# Patient Record
Sex: Male | Born: 1950 | Race: White | Hispanic: No | Marital: Married | Smoking: Former smoker
Health system: Southern US, Community
[De-identification: ages and names within clinical notes are randomized; demographics above are authoritative.]

## PROBLEM LIST (undated history)

## (undated) DIAGNOSIS — G250 Essential tremor: Secondary | ICD-10-CM

## (undated) DIAGNOSIS — N401 Enlarged prostate with lower urinary tract symptoms: Secondary | ICD-10-CM

## (undated) DIAGNOSIS — G629 Polyneuropathy, unspecified: Secondary | ICD-10-CM

## (undated) DIAGNOSIS — Z862 Personal history of diseases of the blood and blood-forming organs and certain disorders involving the immune mechanism: Secondary | ICD-10-CM

## (undated) DIAGNOSIS — G20 Parkinson's disease: Secondary | ICD-10-CM

## (undated) DIAGNOSIS — I10 Essential (primary) hypertension: Secondary | ICD-10-CM

## (undated) DIAGNOSIS — Z978 Presence of other specified devices: Secondary | ICD-10-CM

## (undated) DIAGNOSIS — E785 Hyperlipidemia, unspecified: Secondary | ICD-10-CM

## (undated) DIAGNOSIS — F329 Major depressive disorder, single episode, unspecified: Secondary | ICD-10-CM

## (undated) DIAGNOSIS — D696 Thrombocytopenia, unspecified: Secondary | ICD-10-CM

## (undated) DIAGNOSIS — G894 Chronic pain syndrome: Secondary | ICD-10-CM

## (undated) DIAGNOSIS — Z87898 Personal history of other specified conditions: Secondary | ICD-10-CM

## (undated) DIAGNOSIS — M542 Cervicalgia: Secondary | ICD-10-CM

## (undated) DIAGNOSIS — E538 Deficiency of other specified B group vitamins: Secondary | ICD-10-CM

## (undated) DIAGNOSIS — G20A1 Parkinson's disease without dyskinesia, without mention of fluctuations: Secondary | ICD-10-CM

## (undated) DIAGNOSIS — C61 Malignant neoplasm of prostate: Secondary | ICD-10-CM

## (undated) DIAGNOSIS — R011 Cardiac murmur, unspecified: Secondary | ICD-10-CM

## (undated) DIAGNOSIS — D509 Iron deficiency anemia, unspecified: Secondary | ICD-10-CM

## (undated) DIAGNOSIS — M549 Dorsalgia, unspecified: Secondary | ICD-10-CM

## (undated) DIAGNOSIS — G4733 Obstructive sleep apnea (adult) (pediatric): Secondary | ICD-10-CM

## (undated) DIAGNOSIS — K219 Gastro-esophageal reflux disease without esophagitis: Secondary | ICD-10-CM

## (undated) DIAGNOSIS — K912 Postsurgical malabsorption, not elsewhere classified: Secondary | ICD-10-CM

## (undated) DIAGNOSIS — M810 Age-related osteoporosis without current pathological fracture: Secondary | ICD-10-CM

## (undated) DIAGNOSIS — F411 Generalized anxiety disorder: Secondary | ICD-10-CM

## (undated) DIAGNOSIS — R3912 Poor urinary stream: Secondary | ICD-10-CM

## (undated) HISTORY — DX: Age-related osteoporosis without current pathological fracture: M81.0

## (undated) HISTORY — DX: Poor urinary stream: R39.12

## (undated) HISTORY — PX: BACK SURGERY: SHX140

## (undated) HISTORY — PX: GASTRIC BYPASS: SHX52

## (undated) HISTORY — PX: LUMBAR DISC SURGERY: SHX700

## (undated) HISTORY — DX: Polyneuropathy, unspecified: G62.9

## (undated) HISTORY — PX: TONSILLECTOMY: SUR1361

## (undated) HISTORY — DX: Iron deficiency anemia, unspecified: D50.9

## (undated) HISTORY — DX: Thrombocytopenia, unspecified: D69.6

## (undated) HISTORY — PX: CHOLECYSTECTOMY: SHX55

## (undated) HISTORY — PX: ORIF CLAVICLE FRACTURE: SUR924

---

## 1980-06-06 HISTORY — PX: LUMBAR DISC SURGERY: SHX700

## 1997-07-04 ENCOUNTER — Encounter: Payer: Self-pay | Admitting: Pulmonary Disease

## 1997-10-01 ENCOUNTER — Encounter: Payer: Self-pay | Admitting: Pulmonary Disease

## 1997-10-01 ENCOUNTER — Ambulatory Visit: Admission: RE | Admit: 1997-10-01 | Discharge: 1997-10-01 | Payer: Self-pay | Admitting: Pulmonary Disease

## 2000-06-13 ENCOUNTER — Encounter: Payer: Self-pay | Admitting: Family Medicine

## 2000-06-13 ENCOUNTER — Encounter: Admission: RE | Admit: 2000-06-13 | Discharge: 2000-06-13 | Payer: Self-pay | Admitting: Family Medicine

## 2001-07-05 ENCOUNTER — Encounter: Admission: RE | Admit: 2001-07-05 | Discharge: 2001-10-03 | Payer: Self-pay | Admitting: Surgical Oncology

## 2001-12-03 DIAGNOSIS — Z9884 Bariatric surgery status: Secondary | ICD-10-CM

## 2001-12-03 HISTORY — PX: ROUX-EN-Y GASTRIC BYPASS: SHX1104

## 2001-12-03 HISTORY — DX: Bariatric surgery status: Z98.84

## 2002-06-26 ENCOUNTER — Encounter: Payer: Self-pay | Admitting: General Surgery

## 2002-06-26 ENCOUNTER — Inpatient Hospital Stay (HOSPITAL_COMMUNITY): Admission: EM | Admit: 2002-06-26 | Discharge: 2002-06-28 | Payer: Self-pay | Admitting: Emergency Medicine

## 2002-06-26 ENCOUNTER — Encounter: Payer: Self-pay | Admitting: Emergency Medicine

## 2002-07-11 ENCOUNTER — Encounter: Admission: RE | Admit: 2002-07-11 | Discharge: 2002-07-11 | Payer: Self-pay | Admitting: General Surgery

## 2002-07-11 ENCOUNTER — Encounter: Payer: Self-pay | Admitting: General Surgery

## 2002-07-24 ENCOUNTER — Encounter: Payer: Self-pay | Admitting: General Surgery

## 2002-07-24 ENCOUNTER — Encounter (INDEPENDENT_AMBULATORY_CARE_PROVIDER_SITE_OTHER): Payer: Self-pay | Admitting: Specialist

## 2002-07-24 ENCOUNTER — Observation Stay (HOSPITAL_COMMUNITY): Admission: RE | Admit: 2002-07-24 | Discharge: 2002-07-25 | Payer: Self-pay | Admitting: General Surgery

## 2002-07-24 HISTORY — PX: CHOLECYSTECTOMY, LAPAROSCOPIC: SHX56

## 2002-09-30 ENCOUNTER — Encounter: Admission: RE | Admit: 2002-09-30 | Discharge: 2002-09-30 | Payer: Self-pay | Admitting: Orthopaedic Surgery

## 2002-09-30 ENCOUNTER — Encounter: Payer: Self-pay | Admitting: Orthopaedic Surgery

## 2003-02-09 ENCOUNTER — Encounter: Payer: Self-pay | Admitting: Pulmonary Disease

## 2003-02-09 ENCOUNTER — Ambulatory Visit (HOSPITAL_BASED_OUTPATIENT_CLINIC_OR_DEPARTMENT_OTHER): Admission: RE | Admit: 2003-02-09 | Discharge: 2003-02-09 | Payer: Self-pay | Admitting: Pulmonary Disease

## 2004-01-09 ENCOUNTER — Emergency Department (HOSPITAL_COMMUNITY): Admission: EM | Admit: 2004-01-09 | Discharge: 2004-01-09 | Payer: Self-pay | Admitting: Emergency Medicine

## 2004-02-27 ENCOUNTER — Ambulatory Visit (HOSPITAL_COMMUNITY): Admission: RE | Admit: 2004-02-27 | Discharge: 2004-02-27 | Payer: Self-pay | Admitting: Gastroenterology

## 2004-03-08 ENCOUNTER — Ambulatory Visit (HOSPITAL_COMMUNITY): Admission: RE | Admit: 2004-03-08 | Discharge: 2004-03-08 | Payer: Self-pay | Admitting: Gastroenterology

## 2004-04-07 ENCOUNTER — Ambulatory Visit: Payer: Self-pay | Admitting: Gastroenterology

## 2004-04-13 ENCOUNTER — Ambulatory Visit: Payer: Self-pay | Admitting: Family Medicine

## 2004-11-19 ENCOUNTER — Ambulatory Visit: Payer: Self-pay | Admitting: Internal Medicine

## 2004-11-19 ENCOUNTER — Ambulatory Visit: Payer: Self-pay

## 2004-11-22 ENCOUNTER — Ambulatory Visit: Payer: Self-pay | Admitting: Internal Medicine

## 2005-02-10 ENCOUNTER — Ambulatory Visit: Payer: Self-pay | Admitting: Pulmonary Disease

## 2005-02-14 ENCOUNTER — Ambulatory Visit: Payer: Self-pay | Admitting: Family Medicine

## 2005-02-15 ENCOUNTER — Ambulatory Visit: Payer: Self-pay

## 2005-02-18 ENCOUNTER — Ambulatory Visit: Payer: Self-pay | Admitting: Family Medicine

## 2005-02-23 ENCOUNTER — Ambulatory Visit: Payer: Self-pay

## 2005-04-14 ENCOUNTER — Ambulatory Visit: Payer: Self-pay | Admitting: Internal Medicine

## 2005-08-09 ENCOUNTER — Ambulatory Visit: Payer: Self-pay | Admitting: Pulmonary Disease

## 2005-08-29 ENCOUNTER — Ambulatory Visit: Payer: Self-pay | Admitting: Internal Medicine

## 2005-09-19 ENCOUNTER — Encounter: Admission: RE | Admit: 2005-09-19 | Discharge: 2005-09-19 | Payer: Self-pay | Admitting: Internal Medicine

## 2005-09-23 ENCOUNTER — Ambulatory Visit: Payer: Self-pay | Admitting: *Deleted

## 2005-09-26 ENCOUNTER — Ambulatory Visit: Payer: Self-pay | Admitting: Internal Medicine

## 2005-09-30 ENCOUNTER — Ambulatory Visit: Payer: Self-pay | Admitting: *Deleted

## 2005-10-14 ENCOUNTER — Ambulatory Visit: Payer: Self-pay | Admitting: *Deleted

## 2005-10-21 ENCOUNTER — Ambulatory Visit: Payer: Self-pay | Admitting: *Deleted

## 2005-12-05 ENCOUNTER — Ambulatory Visit: Payer: Self-pay | Admitting: Pulmonary Disease

## 2006-01-11 ENCOUNTER — Ambulatory Visit: Payer: Self-pay | Admitting: Internal Medicine

## 2006-06-27 ENCOUNTER — Ambulatory Visit: Payer: Self-pay | Admitting: Family Medicine

## 2006-06-27 ENCOUNTER — Ambulatory Visit: Payer: Self-pay | Admitting: Internal Medicine

## 2006-06-27 LAB — CONVERTED CEMR LAB
ALT: 16 units/L (ref 0–40)
AST: 21 units/L (ref 0–37)
Albumin: 3.9 g/dL (ref 3.5–5.2)
Alkaline Phosphatase: 50 units/L (ref 39–117)

## 2006-07-25 ENCOUNTER — Ambulatory Visit: Payer: Self-pay | Admitting: Pulmonary Disease

## 2006-09-08 ENCOUNTER — Ambulatory Visit: Payer: Self-pay | Admitting: Internal Medicine

## 2006-11-01 DIAGNOSIS — E669 Obesity, unspecified: Secondary | ICD-10-CM | POA: Insufficient documentation

## 2006-11-01 DIAGNOSIS — I10 Essential (primary) hypertension: Secondary | ICD-10-CM | POA: Insufficient documentation

## 2007-01-08 ENCOUNTER — Ambulatory Visit: Payer: Self-pay | Admitting: Internal Medicine

## 2007-01-08 DIAGNOSIS — R609 Edema, unspecified: Secondary | ICD-10-CM | POA: Insufficient documentation

## 2007-01-08 DIAGNOSIS — K117 Disturbances of salivary secretion: Secondary | ICD-10-CM | POA: Insufficient documentation

## 2007-01-08 DIAGNOSIS — R4182 Altered mental status, unspecified: Secondary | ICD-10-CM

## 2007-01-09 ENCOUNTER — Ambulatory Visit: Payer: Self-pay | Admitting: Internal Medicine

## 2007-01-10 ENCOUNTER — Encounter: Payer: Self-pay | Admitting: Internal Medicine

## 2007-01-15 LAB — CONVERTED CEMR LAB
ALT: 14 units/L (ref 0–53)
AST: 23 units/L (ref 0–37)
Alkaline Phosphatase: 36 units/L — ABNORMAL LOW (ref 39–117)
Bilirubin, Direct: 0.1 mg/dL (ref 0.0–0.3)
Potassium: 4.4 meq/L (ref 3.5–5.1)
Total Protein: 6.3 g/dL (ref 6.0–8.3)
Valproic Acid Lvl: 53 ug/mL (ref 50.0–100.0)

## 2007-05-07 ENCOUNTER — Telehealth (INDEPENDENT_AMBULATORY_CARE_PROVIDER_SITE_OTHER): Payer: Self-pay | Admitting: *Deleted

## 2007-05-15 ENCOUNTER — Ambulatory Visit: Payer: Self-pay | Admitting: Internal Medicine

## 2007-05-18 ENCOUNTER — Ambulatory Visit: Payer: Self-pay | Admitting: Internal Medicine

## 2007-05-18 DIAGNOSIS — G4730 Sleep apnea, unspecified: Secondary | ICD-10-CM | POA: Insufficient documentation

## 2007-05-21 ENCOUNTER — Ambulatory Visit: Payer: Self-pay | Admitting: Internal Medicine

## 2007-05-29 ENCOUNTER — Telehealth: Payer: Self-pay | Admitting: Internal Medicine

## 2007-05-30 LAB — CONVERTED CEMR LAB
ALT: 14 units/L (ref 0–53)
AST: 22 units/L (ref 0–37)
Albumin: 3.6 g/dL (ref 3.5–5.2)
Calcium: 9.3 mg/dL (ref 8.4–10.5)
Chloride: 101 meq/L (ref 96–112)
Creatinine, Ser: 1.1 mg/dL (ref 0.4–1.5)
GFR calc non Af Amer: 74 mL/min
Glucose, Bld: 92 mg/dL (ref 70–99)
Pro B Natriuretic peptide (BNP): 50 pg/mL (ref 0.0–100.0)
Sodium: 144 meq/L (ref 135–145)
Total Bilirubin: 0.5 mg/dL (ref 0.3–1.2)

## 2007-06-05 ENCOUNTER — Telehealth (INDEPENDENT_AMBULATORY_CARE_PROVIDER_SITE_OTHER): Payer: Self-pay | Admitting: *Deleted

## 2007-06-28 ENCOUNTER — Ambulatory Visit: Payer: Self-pay | Admitting: Internal Medicine

## 2007-06-28 DIAGNOSIS — R413 Other amnesia: Secondary | ICD-10-CM | POA: Insufficient documentation

## 2007-06-28 DIAGNOSIS — F329 Major depressive disorder, single episode, unspecified: Secondary | ICD-10-CM

## 2007-06-28 DIAGNOSIS — M549 Dorsalgia, unspecified: Secondary | ICD-10-CM | POA: Insufficient documentation

## 2007-07-05 LAB — CONVERTED CEMR LAB
Basophils Relative: 0.3 % (ref 0.0–1.0)
Eosinophils Relative: 2.2 % (ref 0.0–5.0)
Folate: 12.9 ng/mL
HCT: 37.5 % — ABNORMAL LOW (ref 39.0–52.0)
Neutrophils Relative %: 39.6 % — ABNORMAL LOW (ref 43.0–77.0)
PSA: 0.05 ng/mL — ABNORMAL LOW (ref 0.10–4.00)
Platelets: 124 10*3/uL — ABNORMAL LOW (ref 150–400)
RBC: 4.06 M/uL — ABNORMAL LOW (ref 4.22–5.81)
RDW: 12.9 % (ref 11.5–14.6)
TSH: 2.48 microintl units/mL (ref 0.35–5.50)
Total CHOL/HDL Ratio: 2.6
Triglycerides: 65 mg/dL (ref 0–149)
VLDL: 13 mg/dL (ref 0–40)
WBC: 3.8 10*3/uL — ABNORMAL LOW (ref 4.5–10.5)

## 2007-07-09 ENCOUNTER — Encounter (INDEPENDENT_AMBULATORY_CARE_PROVIDER_SITE_OTHER): Payer: Self-pay | Admitting: *Deleted

## 2007-07-09 ENCOUNTER — Ambulatory Visit: Payer: Self-pay | Admitting: Internal Medicine

## 2007-07-12 ENCOUNTER — Ambulatory Visit: Payer: Self-pay | Admitting: Internal Medicine

## 2007-07-20 ENCOUNTER — Telehealth (INDEPENDENT_AMBULATORY_CARE_PROVIDER_SITE_OTHER): Payer: Self-pay | Admitting: *Deleted

## 2007-07-20 LAB — CONVERTED CEMR LAB
BUN: 20 mg/dL (ref 6–23)
Calcium: 9.6 mg/dL (ref 8.4–10.5)
Chloride: 102 meq/L (ref 96–112)
GFR calc Af Amer: 100 mL/min
GFR calc non Af Amer: 82 mL/min
Glucose, Bld: 101 mg/dL — ABNORMAL HIGH (ref 70–99)

## 2007-07-26 ENCOUNTER — Telehealth (INDEPENDENT_AMBULATORY_CARE_PROVIDER_SITE_OTHER): Payer: Self-pay | Admitting: *Deleted

## 2007-08-14 ENCOUNTER — Inpatient Hospital Stay (HOSPITAL_COMMUNITY): Admission: EM | Admit: 2007-08-14 | Discharge: 2007-08-16 | Payer: Self-pay | Admitting: Emergency Medicine

## 2007-08-14 ENCOUNTER — Ambulatory Visit: Payer: Self-pay | Admitting: Cardiology

## 2007-08-15 ENCOUNTER — Encounter: Payer: Self-pay | Admitting: Internal Medicine

## 2007-08-21 ENCOUNTER — Encounter: Payer: Self-pay | Admitting: Internal Medicine

## 2007-08-22 ENCOUNTER — Encounter: Payer: Self-pay | Admitting: Family Medicine

## 2007-10-16 ENCOUNTER — Emergency Department (HOSPITAL_COMMUNITY): Admission: EM | Admit: 2007-10-16 | Discharge: 2007-10-17 | Payer: Self-pay | Admitting: Emergency Medicine

## 2007-10-26 ENCOUNTER — Ambulatory Visit: Payer: Self-pay | Admitting: Pulmonary Disease

## 2008-03-17 ENCOUNTER — Encounter: Payer: Self-pay | Admitting: Internal Medicine

## 2008-04-15 ENCOUNTER — Encounter: Admission: RE | Admit: 2008-04-15 | Discharge: 2008-06-02 | Payer: Self-pay | Admitting: Neurology

## 2010-10-19 NOTE — H&P (Signed)
NAMETRAPPER, MEECH                ACCOUNT NO.:  [id number]   MEDICAL RECORD NO.:  [id number]          PATIENT TYPE:  INP   LOCATION:  1823                         FACILITY:  [facility]   PHYSICIAN:  Kela Millin, M.D.DATE OF BIRTH:  17-Jun-1950   DATE OF ADMISSION:  08/14/2007  DATE OF DISCHARGE:                              HISTORY & PHYSICAL   PRIMARY CARE PHYSICIAN:  Willow Ora, MD   CHIEF COMPLAINT:  Worsening altered mental status and tremors.   HISTORY OF PRESENT ILLNESS:  The patient is a 60 year old white male  with past medical history significant for chronic low back pain on  chronic narcotics, anxiety and depression, hypertension, obesity and  status post gastric bypass surgery in 2003, who presents with the above  complaints.  His wife states that he was a little bit more confused and  also more shaky/tremulous and so she was brought him to the ER.  At the  time of my interview in the ER, the patient is awake and able to give  the history.  He states that he his biggest problem is somnolence.  He  falls asleep at any time no matter what he is doing and even sometimes  well while driving, and that he has had this problem for a good while  now.  He also states that he has had more swelling in his legs in the  past week.  He denies shortness of breath.  He also denies any  increasing abdominal girth.  His wife states that he is not able to  remember even things that just happened the night before and today he  was a little bit more confused.  She also states that he has had  problems with his ammonia level, and that his psychiatrist treated it  with Carnipure.   He was seen in the emergency room and an ammonia level was done, which  was elevated at 58.  His urinalysis was unremarkable.  A valproic acid  level was also noted to be elevated at 102.3.  He is admitted for  further evaluation and management.  The patient has three 75-mcg patches  on and states that sometimes he  does not remember exactly when he put  them on.   PAST MEDICAL HISTORY:  As above.   MEDICATIONS:  1. Depakote ER 500 mg 4 tablets nightly.  2. Morphine 30 mg q.8 h.  3. Alprazolam 0.5 mg b.i.d.  4. Duragesic 75 mcg two patches every other day, although he states      that he puts one on every day.  5. Provigil 200 mg 1-2 tablets p.r.n.  He states that he has not taken      this in a long time.  6. Spironolactone 25 mg, 2 tablets daily.  7. Lamotrigine 100 mg daily.  8. Carnipure 2 tablets daily.  9. He has Ambien listed but states that he very rarely takes it.   ALLERGIES:  No known drug allergies.   SOCIAL HISTORY:  He quit tobacco 15 years ago.  He also states that he  quit alcohol 10-12  years ago.   FAMILY HISTORY:  His brother has multiple sclerosis and his mother had  Alzheimer's.   REVIEW OF SYSTEMS:  As per HPI, other review of systems negative.   PHYSICAL EXAM:  GENERAL:  The patient is an obese, middle-aged white  male.  He looks older than he is stated age, in no apparent distress.  VITAL SIGNS:  His temperature is 97.6, blood pressure is 125/65 with a  pulse of 55, respiratory rate of 20, O2 saturation of 95%.  HEENT:  PERRL, EOMI, sclerae anicteric, moist mucous membranes.  NECK:  Supple, no adenopathy, no thyromegaly and no JVD.  LUNGS:  Decreased breath sounds at bases, otherwise clear to  auscultation.  CARDIOVASCULAR:  Regular rate and rhythm.  Normal S1 and S2.  ABDOMEN:  Obese, soft, bowel sounds present, nontender, nondistended.  No organomegaly is appreciated, no masses palpable.  EXTREMITIES:  He has +2 with edema bilaterally.  No cyanosis.  He has a  slight tremor on the right hand.  NEUROLOGIC:  He is awake and oriented x2.  He follows commands.  Cranial  nerves II-XII are grossly intact.  His strength is 5/5 and symmetric.   LABORATORY DATA:  His white cell count is 3.7, hemoglobin 12.6,  hematocrit 36, his platelet count is 134, neutrophil count  of 45%.  Ammonia level is 58.  Urinalysis is unremarkable.  His sodium is 142,  potassium 4.2, chloride 103, CO2 of 32, glucose 89, BUN of 25,  creatinine 0.94, calcium 9.5.  Total protein is 6.8, albumin is 3.8, AST  is 26, ALT is 16.  Valproic acid is 102.3.   1. Altered mental status:  Medications versus elevated ammonia level      versus neurologic, medications likely playing a greater role, as      discussed above.  His valproic acid is mildly supratherapeutic as      well.  I will hold his Depakote and recheck levels, will also      decrease doses of his other sedating medications.  Recommend      psychiatry consultation later this a.m. to assist with further      recommendations regarding his medications.  Also as noted above,      his ammonia level is mildly elevated.  We will place the patient on      lactulose.  He denies any history of liver disease and his liver      function tests are unremarkable.  He states that he has had this      problem in the past.  We will also obtain a CT scan of his head to      further evaluate.  As noted above, his urinalysis is unremarkable.  2. Supratherapeutic valproic acid level:  As discussed above, we will      hold Depakote and recheck levels.  3. Elevated ammonia level:  As above.  The patient denies history of      liver disease.  Will treat with lactulose, follow and recheck.  4. Peripheral edema:  We will obtain a brain natriuretic peptide level      and consider a 2-D echo if elevated.  He is on spironolactone, will      add Lasix and follow.  As noted above, he denies  history of liver      disease and liver function tests unremarkable as above.  5. Anxiety/depression:  As above, doses of his sedating medications      decreased, psychiatry  consulted for further recommendations.  6. History of hypertension:  On no blood pressure medications, monitor      and treat accordingly.  7. Chronic low back pain:  Decreased pain medications as  above.      Follow.  8. Obesity, status post gastric bypass surgery in 2003.      Kela Millin, M.D.  Electronically Signed    ACV/MEDQ  D:  08/14/2007  T:  08/14/2007  Job:  [id number]   cc:   Willow Ora, MD

## 2010-10-19 NOTE — Discharge Summary (Signed)
NAMEEARLY, STEEL                ACCOUNT NO.:  [id number]   MEDICAL RECORD NO.:  [id number]          PATIENT TYPE:  INP   LOCATION:  4705                         FACILITY:  [facility]   PHYSICIAN:  Valerie A. Felicity Miller, MDDATE OF BIRTH:  03/09/1951   DATE OF ADMISSION:  08/14/2007  DATE OF DISCHARGE:  08/16/2007                               DISCHARGE SUMMARY   DISCHARGE DIAGNOSES:  1. Altered mental status in the setting of elevated valproic acid      level and elevated ammonia level.  2. Hypertension.  3. Chronic back pain.  4. Anxiety/depression.  5. Obesity, status post gastric bypass in 2003.  6. History of cholelithiasis.  7. Bilateral lower extremity edema with normal left ventricular      ejection fraction per 2D echocardiogram performed in this      admission.  8. Resting tremor scheduled for neurological consult next week in the      office.  9. Chronic asymptomatic bradycardia.   HISTORY OF PRESENT ILLNESS:  Theodore Miller is a 60 year old white male who  was admitted on August 14, 2007, due to worsening altered mental status  and tremors.  His past medical history is significant for chronic low  back pain treated with chronic narcotics as well as anxiety, depression,  and obesity.  According to the patient's wife on admission, he had been  a little bit more confused as well as more shaky and tremulous and was  brought to the emergency department.  He also complained of somnolence  and apparently has even had somnolence while driving.  He was admitted  for further evaluation and treatment.   COURSE OF HOSPITALIZATION:  Altered mental status.  The patient was  admitted and underwent a head CT, which showed mild chronic white matter  ischemia.  There were no acute changes.  He was noted to have an  elevated valproic acid level at the time of admission.  According to the  patient, he had recently had his Depakote ER 500 mg tablets increased  from 3 tablets daily to 4 tablets  daily.  His Depakote was held.  At  this time, we plan to resume Depakote at his former dosing of 1500 mg  p.o. daily with Cozaar.  The patient followed up with his psychiatrist.  He was noted to have an elevated ammonia level which was likely  contributing to his confusion. This was treated with lactulose which he  will continue at the time of discharge.  We also have sent off a  hepatitis panel which is presently pending.  It may be that the  patient's elevated ammonia levels are secondary to Depakote, and he may  need consideration for different psychiatric medications.  We will defer  these changes to his psychiatrist.  The patient's MS Contin was  decreased from q.8 h. to q.12 h. during this admission; however, he  continued with severe breakthrough pain.  We will resume his dosing as  prior to admission at the time of discharge, however, he is instructed  to hold MS Contin for sedation.  The patient's daytime somnolence  is  likely multifactorial in the setting of his elevated ammonia levels,  large narcotic dosing, and history of obstructive sleep apnea with  noncompliance with CPAP.  He is instructed to comply with his CPAP, and  as he has noted episodes of falling asleep even while driving, he will  be instructed not to drive pending further evaluation and clearance by  his primary care physicians.  Also of note, his liver function tests  were within normal limits.   The patient is noted to have 3 to 4+ bilateral lower extremity edema and  will be continued on Lasix as prior to admission at the time of  discharge.  He did undergo a 2D echo performed during this admission,  which noted normal LV function of 55%-60%.   MEDICATIONS AT THE TIME OF DISCHARGE:  1. Lamictal 100 mg p.o. daily.  2. Prozac 20 mg p.o. daily.  3. Xanax 0.25 mg p.o. b.i.d.  Note, this is a new dose for the patient      as his previous dosing was 0.5 mg twice daily.  4. Duragesic patch 75 mcg 2 patches to  be applied.  The patient      apparently changed 1 patch daily per [facility] instructions.  5. Morphine 30 mg p.o. q.8 h. to be held for sedation.  6. Lasix 20 mg p.o. daily.  7. Lamotrigine 100 mg p.o. daily.  8. Depakote ER 500 mg 3 tablets p.o. daily.  9. Furosemide 20 mg p.o. daily.  10.Carnipure 2 tablets p.o. daily.  11.Chronulac 30 mL 3 times daily with a goal of 2 soft stools per day.      If greater than 2 stools per day, the patient was instructed to cut      back to 30 mL p.o. b.i.d.   PERTINENT LABORATORIES:  At the time of discharge, BUN 12, creatinine  0.95, valproic acid level 65.3, and BNP 85.   DISPOSITION:  The patient will be discharged to home.   FOLLOWUP:  He is instructed to follow up with Dr. Marga Melnick on  Tuesday, August 21, 2007, at 11 a.m.  He will need followup of his  hepatitis panel, which is currently pending at the time of this  dictation.  He is also instructed to follow up with Dr. Tiajuana Amass  within 1 week for further discussions in regards to Depakote dosing.      Theodore Craze, Theodore Miller      Theodore Rover. Felicity Coyer, Theodore Miller  Electronically Signed    MO/MEDQ  D:  08/16/2007  T:  08/16/2007  Job:  [id number]   cc:   Tiajuana Amass, Theodore Miller  Titus Dubin. Alwyn Ren, Theodore Miller,FACP,FCCP

## 2010-10-22 NOTE — Discharge Summary (Signed)
NAME:  Theodore Miller, Theodore Miller                          ACCOUNT NO.:  [id number]   MEDICAL RECORD NO.:  [id number]                   PATIENT TYPE:  INP   LOCATION:  0356                                 FACILITY:  [facility]   PHYSICIAN:  Rene Paci, M.D. [facility]          DATE OF BIRTH:  1950-09-10   DATE OF ADMISSION:  06/26/2002  DATE OF DISCHARGE:  06/28/2002                                 DISCHARGE SUMMARY   DISCHARGE DIAGNOSES:  1. Abdominal pain, nausea, vomiting with elevated LFTs consistent with     cholelithiasis, passed common bile duct stone, resolved.  2. Chronic low back pain.  3. Hypertension.  4. Obesity status post gastric bypass December 03, 2001 by Dr. Jayme Cloud in     [place].   DISCHARGE MEDICATIONS:  As prior to admission.  1. Percocet one to two tablets q.4h. p.r.n. pain.  2. Duragesic 50 mcg patch q.3 days.  3. Hydrochlorothiazide 12.5 mg daily.  4. Neurontin q.h.s.   DISPOSITION:  The patient is discharged to home where he lives with his  wife.  He will contact Anselm Pancoast. Zachery Dakins, M.D. of general surgery for  follow-up next week regarding elective cholecystectomy.   CONDITION ON DISCHARGE:  Medically stable and improved.   BRIEF ADMISSION HISTORY AND PHYSICAL:  The patient is a 60 year old obese  white gentleman who presented to the emergency room on his 60th birthday  complaining of sudden onset pain beneath his ribs associated with severe  nausea 11 p.m. night prior to admission associated with severe nausea and  diaphoresis, no vomiting.  Eased off and went to sleep, but reawoke that  morning with worsening pain.  Because of shortness of breath and concern for  possible heart attack, called 911.  No previous symptoms of similar  episodes.  Evaluation in the ER showed no EKG changes, normal cardiac  enzymes, concern for possible partial small bowel on plain films.  The  patient was admitted for further evaluation and pain control.  Please see  H&P as in  chart for further details.   HOSPITAL COURSE:  Problem 1 - ABDOMINAL PAIN WITH NAUSEA AND ELEVATED LFTs:  At time of admission patient was afebrile and heavily medicated for his  right upper quadrant pain.  An ultrasound was performed which showed mild  hepatomegaly, but no stones or common bile duct dilation.  His AST was 214,  ALT of 135 and otherwise CMET was normal.  Given his history of gastric  bypass surgery with concern for possible bowel obstruction, Anselm Pancoast.  Zachery Dakins, M.D. of general surgery was consulted.  A CT of the abdomen was  performed which excluded any evidence of obstruction.  No gallbladder or  pancreatitis were identified.  The following morning patient's LFTs were  significantly elevated with an AST of 313, ALT of 573, and an alkaline  phosphatase of 122.  His symptoms had resolved and patient was now  tolerating diet  without difficulty.  His clinical path was consistent with  cholelithiasis, though none could be identified on imaging studies.  By the  third day of hospitalization his LFTs were again trending down with AST now  115 and ALT of 319.  The patient and Anselm Pancoast. Zachery Dakins, M.D. decided to  evaluate for an elective cholecystectomy next week and patient is free to be  discharged home as his symptoms have currently resolved and he is tolerating  regular diet.  He remained afebrile and therefore no empiric antibiotics  were given as there was no evidence of cholecystitis.  Problem 2 - CHRONIC BACK PAIN:  The patient is involved with Pain Clinic and  was substituted Demerol in place of his usual Percocet p.r.n.  He continued  his Duragesic patch every three days and patient is discharged home on his  previous medications.  Problem 3 - HYPERTENSION:  The patient's blood pressure was elevated likely  due to abdominal pain at admission and had resolved to its normal level by  time of discharge.  He will resume his home medications.                                                Rene Paci, M.D. [facility]    VL/MEDQ  D:  06/28/2002  T:  06/28/2002  Job:  [id number]

## 2010-10-22 NOTE — Op Note (Signed)
NAME:  DRAE, MITZEL                          ACCOUNT NO.:  [id number]   MEDICAL RECORD NO.:  [id number]                   PATIENT TYPE:  AMB   LOCATION:  DAY                                  FACILITY:  [facility]   PHYSICIAN:  Anselm Pancoast. Zachery Dakins, M.D.          DATE OF BIRTH:  [birth year]   DATE OF PROCEDURE:  07/24/2002  DATE OF DISCHARGE:                                 OPERATIVE REPORT   PREOPERATIVE DIAGNOSES:  1. Chronic cholecystitis.  2. Recent open gastric bypass surgery for exogenous obesity.  3. Recent passage of common duct stone.   POSTOPERATIVE DIAGNOSES:  1. Chronic cholecystitis.  2. Recent open gastric bypass surgery for exogenous obesity.  3. Recent passage of common duct stone.   OPERATION:  Laparoscopic cholecystectomy with cholangiogram.   HISTORY:  Lamere Lightner is a 60 year old male who approximately six months  ago had an open gastric jejunostomy for exogenous obesity in a [facility] and presented to the emergency room here June 26, 2002 with  severe abdominal pain and was admitted by Dr. Felicity Coyer on the [place] service  for the abdominal pain. The ultrasound failed to show gallstones but his  liver tests were definitely abnormal. Initially his SGOT and SGPT were  mildly elevated and his liver shows kind of a fatty liver. He lost about 75  pounds after this surgery and the following day his pain was improved but  his liver tests were much more elevated with a SGOT, SGPT of 313, 377.  Clinically it was thought that he had passed a common duct stone. The  patient was released and followed up in the office. Repeat ultrasound did  not show any stones but a hepatobiliary scan was performed and this showed  an obstructive cystic duct and I saw him back last week and recommended we  proceed with a cholecystectomy. If the patient does have a common duct  stone, it will be necessary to do it open since it is impossible to do an  ERCP since he has had a  gastric division and gastrojejunostomy and hopefully  we can do his surgery with the laparoscope.   DESCRIPTION OF PROCEDURE:  The patient preoperatively was given 3 g of  Unasyn, he has PAS stockings, positioned on the OR table with induction of  general anesthesia. The abdomen was prepped with Betadine surgical solution,  draped in a sterile manner. A small incision was made just above the  umbilicus, the fascia identified and was about 3-4 inches in thickness and  then I carefully opened into the peritoneal cavity and sort of finger  dissected to free up the adhesions right at this area. The Hasson cannula  was introduced after placing the traction sutures and we were in the  peritoneal cavity. There were of course adhesions from the midline and  adhesions laterally. It was not possible to really see the liver but we were  definitely in the peritoneal cavity. We then placed a 5 mm port laterally  and Dr. Carolynne Edouard who was assisting, took down the adhesions medially towards the  midline so I could put the upper 10 mm trocar at the midline _________  subxiphoid area which was right above where he had his gastric midline  incision. Then the lateral 5 mm second trocar was placed and then we took  and dropped adhesions down and then could see the gallbladder. There was a  very dense adhesion medially which we had to take down and switch to the 30  degree scope and then we could see the proximal portion of the gallbladder.  It was a very long banana shaped gallbladder and we retracted upward and  outward and then dissected down where I thought the junction of the  gallbladder and cystic duct should be, identified the cystic duct lymph node  and then actually encompassed the cystic duct and then actually placed a  clip flush with the junction of the cystic duct and the gallbladder. Opened  up the cystic duct and there were little stones that we kind of milked back.  We then placed a Cook catheter  into the proximal cystic duct. An x-ray was  obtained and he has got a long tortous cystic duct. Contrast goes into the  duodenum and it does not appear to be a stone. The radiologist thought that  we did not have the cystic duct completely dilated but thought it was mostly  normal. We then sort of freed up another about an inch of the cystic duct  and triply clipped it, divided it. Next, the cystic artery, we were able to  encompass it with a right angle and then doubly clipped it what I think was  both the posterior end to anterior branch and then freed the gallbladder up  removing it from its bed. I then placed the gallbladder in an EndoCatch bag  and thoroughly irrigated. There was a little more bloody fluid than usual  because of the adhesions that we had taken down and there was a little area  medially that had been bleeding close to where the upper 10 mm trocar had  been placed that we coagulated for control using hemostasis. We then removed  the irrigating fluid, reinspected the bed, the gallbladder placed in the  EndoCatch bag and we brought it out the umbilicus. We had switched back to  the straight camera at this point and then I closed the fascia at the  umbilicus with #1 Vicryl. Next, the lateral 5 mm trocar was withdrawn,  reinspected and there was no bleeding. The carbon dioxide released, the  upper 10 mm trocar withdrawn. I did place one fascial stitch in the upper 10  mm trocar with an #0 Vicryl. Next, the subcutaneous wounds the deeper  portions closed with 3-0 Vicryl, 4-0 Vicryl in the subcuticular and then  Benzoin and Steri-Strips on the incisions. The patient tolerated the  procedure nicely.  Hopefully he will not be too nauseated and be able to  resume a diet tomorrow and then follow him up in the office and hopefully be  discharged tomorrow.                                                Anselm Pancoast. Zachery Dakins, M.D.   WJW/MEDQ  D:  07/24/2002  T:  07/24/2002   Job:  [id number]   cc:   Rene Paci, M.D. [facility]

## 2011-02-28 LAB — URINALYSIS, ROUTINE W REFLEX MICROSCOPIC
Bilirubin Urine: NEGATIVE
Nitrite: NEGATIVE
Specific Gravity, Urine: 1.019
Urobilinogen, UA: 0.2
pH: 6

## 2011-02-28 LAB — CBC
MCHC: 35
RBC: 3.88 — ABNORMAL LOW

## 2011-02-28 LAB — BASIC METABOLIC PANEL
BUN: 12
Calcium: 9.3
Creatinine, Ser: 0.95
GFR calc non Af Amer: >60
Glucose, Bld: 86

## 2011-02-28 LAB — CK TOTAL AND CKMB (NOT AT ARMC)
CK, MB: 4.2 — ABNORMAL HIGH
Relative Index: 2.2

## 2011-02-28 LAB — COMPREHENSIVE METABOLIC PANEL
ALT: 16
AST: 26
Alkaline Phosphatase: 41
CO2: 32
Calcium: 9.5
GFR calc Af Amer: >60
Potassium: 4.2
Sodium: 142
Total Protein: 6.8

## 2011-02-28 LAB — DIFFERENTIAL
Eosinophils Absolute: 0.1
Eosinophils Relative: 2
Lymphs Abs: 1.6
Monocytes Relative: 10

## 2011-02-28 LAB — HEPATITIS PANEL, ACUTE
Hep B C IgM: NEGATIVE
Hepatitis B Surface Ag: NEGATIVE

## 2011-02-28 LAB — PROTIME-INR
INR: 1
Prothrombin Time: 13.8

## 2011-02-28 LAB — APTT: aPTT: 28

## 2016-02-05 DIAGNOSIS — Z8711 Personal history of peptic ulcer disease: Secondary | ICD-10-CM

## 2016-02-05 HISTORY — DX: Personal history of peptic ulcer disease: Z87.11

## 2016-05-06 HISTORY — PX: ESOPHAGOGASTRODUODENOSCOPY: SHX1529

## 2016-06-06 HISTORY — PX: CATARACT EXTRACTION W/ INTRAOCULAR LENS IMPLANT: SHX1309

## 2017-01-05 ENCOUNTER — Other Ambulatory Visit: Payer: Self-pay | Admitting: Internal Medicine

## 2017-01-10 ENCOUNTER — Other Ambulatory Visit (HOSPITAL_COMMUNITY): Payer: Self-pay | Admitting: Internal Medicine

## 2017-01-10 DIAGNOSIS — G542 Cervical root disorders, not elsewhere classified: Secondary | ICD-10-CM

## 2017-01-16 ENCOUNTER — Encounter (HOSPITAL_COMMUNITY): Payer: Self-pay

## 2017-01-16 ENCOUNTER — Ambulatory Visit (HOSPITAL_COMMUNITY)
Admission: RE | Admit: 2017-01-16 | Discharge: 2017-01-16 | Disposition: A | Discharge status: Home / Self Care | Payer: Managed Care, Other (non HMO) | Source: Ambulatory Visit | Attending: Internal Medicine | Admitting: Internal Medicine

## 2017-01-19 ENCOUNTER — Telehealth: Payer: Self-pay | Admitting: Internal Medicine

## 2017-01-20 ENCOUNTER — Ambulatory Visit (HOSPITAL_COMMUNITY): Admission: RE | Admit: 2017-01-20 | Payer: Managed Care, Other (non HMO) | Source: Ambulatory Visit

## 2017-02-01 ENCOUNTER — Ambulatory Visit (HOSPITAL_COMMUNITY)
Admission: RE | Admit: 2017-02-01 | Discharge: 2017-02-01 | Disposition: A | Discharge status: Home / Self Care | Payer: Managed Care, Other (non HMO) | Source: Ambulatory Visit | Attending: Internal Medicine | Admitting: Internal Medicine

## 2017-02-01 DIAGNOSIS — G542 Cervical root disorders, not elsewhere classified: Secondary | ICD-10-CM

## 2017-02-01 DIAGNOSIS — M47812 Spondylosis without myelopathy or radiculopathy, cervical region: Secondary | ICD-10-CM | POA: Insufficient documentation

## 2017-02-01 DIAGNOSIS — M4802 Spinal stenosis, cervical region: Secondary | ICD-10-CM | POA: Diagnosis not present

## 2017-03-23 ENCOUNTER — Other Ambulatory Visit: Payer: Self-pay | Admitting: Neurosurgery

## 2017-03-23 ENCOUNTER — Other Ambulatory Visit (HOSPITAL_COMMUNITY): Payer: Self-pay | Admitting: Neurosurgery

## 2017-03-23 DIAGNOSIS — M47892 Other spondylosis, cervical region: Secondary | ICD-10-CM

## 2017-04-12 ENCOUNTER — Ambulatory Visit (HOSPITAL_COMMUNITY): Payer: Medicare Other

## 2017-04-12 ENCOUNTER — Ambulatory Visit (HOSPITAL_COMMUNITY): Admission: RE | Admit: 2017-04-12 | Payer: Medicare Other | Source: Ambulatory Visit

## 2018-01-17 ENCOUNTER — Other Ambulatory Visit: Payer: Self-pay | Admitting: Neurosurgery

## 2018-01-17 ENCOUNTER — Other Ambulatory Visit (HOSPITAL_COMMUNITY): Payer: Self-pay | Admitting: Neurosurgery

## 2018-01-17 DIAGNOSIS — M5416 Radiculopathy, lumbar region: Secondary | ICD-10-CM

## 2018-01-17 DIAGNOSIS — M47892 Other spondylosis, cervical region: Secondary | ICD-10-CM

## 2018-01-18 MED ORDER — SODIUM CHLORIDE 0.9 % IV SOLN: 4.0000 mg | Freq: Four times a day (QID) | INTRAVENOUS | Status: AC | PRN

## 2018-02-07 ENCOUNTER — Ambulatory Visit (HOSPITAL_COMMUNITY)
Admission: RE | Admit: 2018-02-07 | Discharge: 2018-02-07 | Disposition: A | Discharge status: Home / Self Care | Payer: Managed Care, Other (non HMO) | Source: Ambulatory Visit | Attending: Neurosurgery | Admitting: Neurosurgery

## 2018-02-07 DIAGNOSIS — M5136 Other intervertebral disc degeneration, lumbar region: Secondary | ICD-10-CM | POA: Insufficient documentation

## 2018-02-07 DIAGNOSIS — M47892 Other spondylosis, cervical region: Secondary | ICD-10-CM | POA: Diagnosis not present

## 2018-02-07 DIAGNOSIS — M48061 Spinal stenosis, lumbar region without neurogenic claudication: Secondary | ICD-10-CM | POA: Diagnosis not present

## 2018-02-07 DIAGNOSIS — M4722 Other spondylosis with radiculopathy, cervical region: Secondary | ICD-10-CM | POA: Insufficient documentation

## 2018-02-07 DIAGNOSIS — M40292 Other kyphosis, cervical region: Secondary | ICD-10-CM | POA: Diagnosis not present

## 2018-02-07 DIAGNOSIS — M50222 Other cervical disc displacement at C5-C6 level: Secondary | ICD-10-CM | POA: Diagnosis not present

## 2018-02-07 DIAGNOSIS — N3289 Other specified disorders of bladder: Secondary | ICD-10-CM | POA: Diagnosis not present

## 2018-02-07 DIAGNOSIS — M5416 Radiculopathy, lumbar region: Secondary | ICD-10-CM

## 2018-02-07 MED ORDER — DIAZEPAM 5 MG PO TABS
10.0000 mg | ORAL_TABLET | Freq: Once | ORAL | Status: AC
  Administered 2018-02-07: 10 mg via ORAL
  Filled 2018-02-07: qty 2

## 2018-02-07 MED ORDER — OXYCODONE HCL 5 MG PO TABS
ORAL_TABLET | ORAL | Status: AC
  Filled 2018-02-07: qty 1

## 2018-02-07 MED ORDER — OXYCODONE HCL 5 MG PO TABS
5.0000 mg | ORAL_TABLET | ORAL | Status: DC | PRN
  Administered 2018-02-07: 5 mg via ORAL

## 2018-02-07 MED ORDER — IOPAMIDOL (ISOVUE-M 300) INJECTION 61%
15.0000 mL | Freq: Once | INTRAMUSCULAR | Status: AC | PRN
  Administered 2018-02-07: 15 mL via INTRATHECAL

## 2018-02-07 MED ORDER — ONDANSETRON HCL 4 MG/2ML IJ SOLN: 4.0000 mg | Freq: Four times a day (QID) | INTRAMUSCULAR | Status: DC | PRN

## 2018-02-07 MED ORDER — LIDOCAINE HCL (PF) 1 % IJ SOLN
5.0000 mL | Freq: Once | INTRAMUSCULAR | Status: AC
  Administered 2018-02-07: 5 mL via INTRADERMAL

## 2018-02-07 MED ORDER — DIAZEPAM 5 MG PO TABS
ORAL_TABLET | ORAL | Status: AC
  Filled 2018-02-07: qty 2

## 2018-02-07 NOTE — Discharge Instructions (Signed)
Myelogram, Care After °These instructions give you information about caring for yourself after your procedure. Your doctor may also give you more specific instructions. Call your doctor if you have any problems or questions after your procedure. °Follow these instructions at home: °· Drink enough fluid to keep your pee (urine) clear or pale yellow. °· Rest as told by your doctor. °· Lie flat with your head slightly raised (elevated). °· Do not bend, lift, or do any hard activities for 24-48 hours or as told by your doctor. °· Take over-the-counter and prescription medicines only as told by your doctor. °· Take care of and remove your bandage (dressing) as told by your doctor. °· Bathe or shower as told by your doctor. °Contact a health care provider if: °· You have a fever. °· You have a headache that lasts longer than 24 hours. °· You feel sick to your stomach (nauseous). °· You throw up (vomit). °· Your neck is stiff. °· Your legs feel numb. °· You cannot pee. °· You cannot poop (have a bowel movement). °· You have a rash. °· You are itchy or sneezing. °Get help right away if: °· You have new symptoms or your symptoms get worse. °· You have a seizure. °· You have trouble breathing. °This information is not intended to replace advice given to you by your health care provider. Make sure you discuss any questions you have with your health care provider. °Document Released: 03/01/2008 Document Revised: 01/21/2016 Document Reviewed: 03/05/2015 °Elsevier Interactive Patient Education © 2018 [employer] ° °

## 2018-02-07 NOTE — Op Note (Signed)
*   No surgery found * Cervical and lumbar Myelogram  PATIENT:  Theodore Miller is a 67 y.o. male  PRE-OPERATIVE DIAGNOSIS:  Cervicalgia, lumbago  POST-OPERATIVE DIAGNOSIS:  same  PROCEDURE:  Lumbar Myelogram  SURGEON:  Flavius Repsher  ANESTHESIA:   local LOCAL MEDICATIONS USED:  LIDOCAINE  Procedure Note: Theodore Miller is a 67 y.o. male Was taken to the fluoroscopy suite and  positioned prone on the fluoroscopy table. His back was prepared and draped in a sterile manner. I infiltrated 8 cc into the lumbar region. I then introduced a spinal needle into the thecal sac at the L5/s1 interlaminar space. I infiltrated 10cc of Isovue 300 into the thecal sac. Fluoroscopy showed the needle and contrast in the thecal sac. Theodore Miller tolerated the procedure well. he Will be taken to CT for evaluation.     PATIENT DISPOSITION:  Short Stay

## 2018-03-09 ENCOUNTER — Ambulatory Visit (INDEPENDENT_AMBULATORY_CARE_PROVIDER_SITE_OTHER): Payer: Managed Care, Other (non HMO) | Admitting: Psychiatry

## 2018-03-09 DIAGNOSIS — F331 Major depressive disorder, recurrent, moderate: Secondary | ICD-10-CM

## 2018-03-09 MED ORDER — ARMODAFINIL 250 MG PO TABS: 250.0000 mg | ORAL_TABLET | Freq: Every day | ORAL | 2 refills | Status: DC

## 2018-03-09 MED ORDER — BUPROPION HCL ER (XL) 150 MG PO TB24: 450.0000 mg | ORAL_TABLET | Freq: Every day | ORAL | 2 refills | Status: DC

## 2018-03-09 MED ORDER — ALPRAZOLAM 0.5 MG PO TABS: 0.5000 mg | ORAL_TABLET | Freq: Three times a day (TID) | ORAL | 2 refills | Status: DC | PRN

## 2018-03-09 MED ORDER — FLUOXETINE HCL 20 MG PO CAPS: 60.0000 mg | ORAL_CAPSULE | Freq: Every day | ORAL | 2 refills | Status: DC

## 2018-03-09 NOTE — Progress Notes (Signed)
[facility]  Patient ID: Theodore Miller,  MRN: [id number]  PCP: Colon Branch, MD  Date of Evaluation: 03/09/2018 Time spent:20 minutes   HISTORY/CURRENT STATUS: HPI patient reports depression is about the same.  He is also had a flu.  His wife said he is more aggressive or more angry since he is lately.  Individual Medical History/ Review of Systems: Changes? :No  Allergies: Patient has no known allergies.  Current Medications:  Current Outpatient Medications:  .  buPROPion (WELLBUTRIN XL) 150 MG 24 hr tablet, Take 3 tablets (450 mg total) by mouth daily., Disp: 90 tablet, Rfl: 2 .  cholecalciferol (VITAMIN D) 1000 units tablet, Take 1,000 Units by mouth daily., Disp: , Rfl:  .  docusate sodium (COLACE) 100 MG capsule, Take 100 mg by mouth daily., Disp: , Rfl:  .  esomeprazole (NEXIUM) 40 MG capsule, Take 40 mg by mouth daily. , Disp: , Rfl:  .  ferrous sulfate 325 (65 FE) MG tablet, Take 325 mg by mouth daily with breakfast. , Disp: , Rfl:  .  levorphanol (LEVODROMORAN) 2 MG tablet, Take 2 mg by mouth 2 (two) times daily., Disp: , Rfl:  .  lisinopril (PRINIVIL,ZESTRIL) 5 MG tablet, Take 5 mg by mouth daily., Disp: , Rfl:  .  Oxycodone HCl 10 MG TABS, Take 10 mg by mouth 4 (four) times daily. , Disp: , Rfl:  .  rosuvastatin (CRESTOR) 20 MG tablet, Take 20 mg by mouth every evening., Disp: , Rfl:  .  vitamin C (ASCORBIC ACID) 500 MG tablet, Take 500 mg by mouth daily. , Disp: , Rfl:  .  ALPRAZolam (XANAX) 0.5 MG tablet, Take 1 tablet (0.5 mg total) by mouth 3 (three) times daily as needed for anxiety., Disp: 90 tablet, Rfl: 2 .  Armodafinil (NUVIGIL) 250 MG tablet, Take 1 tablet (250 mg total) by mouth daily., Disp: 30 tablet, Rfl: 2 .  FLUoxetine (PROZAC) 20 MG capsule, Take 3 capsules (60 mg total) by mouth daily., Disp: 90 capsule, Rfl: 2 .  topiramate (TOPAMAX) 50 MG tablet, Take 50 mg by mouth 2 (two) times daily., Disp: , Rfl:  No current facility-administered  medications for this visit.   Facility-Administered Medications Ordered in Other Visits:  .  ondansetron (ZOFRAN) 4 mg in sodium chloride 0.9 % 50 mL IVPB, 4 mg, Intravenous, Q6H PRN, Ashok Pall, MD Medication Side Effects: None  Family Medical/ Social History: Changes? No  MENTAL HEALTH EXAM:  There were no vitals taken for this visit.There is no height or weight on file to calculate BMI.  General Appearance: Casual  Eye Contact:  Good  Speech:  Normal Rate  Volume:  Normal  Mood:  Euthymic  Affect:  Appropriate  Thought Process:  Linear  Orientation:  Full (Time, Place, and Person)  Thought Content: Logical   Suicidal Thoughts:  No  Homicidal Thoughts:  No  Memory:  Immediate  Judgement:  Good  Insight:  Good  Psychomotor Activity:  Normal  Concentration:  Concentration: Good  Recall:  Good  Fund of Knowledge: Good  Language: Good  Akathisia:  NA  AIMS (if indicated): na  Assets:  Desire for Improvement  ADL's:  Intact  Cognition: WNL  Prognosis:  Good    DIAGNOSES:    ICD-10-CM   1. Major depressive disorder, recurrent episode, moderate (HCC) F33.1 FLUoxetine (PROZAC) 20 MG capsule    ALPRAZolam (XANAX) 0.5 MG tablet    Armodafinil (NUVIGIL) 250 MG tablet  buPROPion (WELLBUTRIN XL) 150 MG 24 hr tablet    RECOMMENDATIONS: Patient is doing well he is to continue his same medicine regime if he has any side effects   Such as dizziness he should see his family doctor.  Patient is to return in 3 months.    Comer Locket, PA-C

## 2018-03-09 NOTE — Patient Instructions (Signed)
Patient is instructed to continue same medications.  Patient is instructed to return in 3 months

## 2018-05-23 ENCOUNTER — Encounter: Payer: Self-pay | Admitting: Emergency Medicine

## 2018-06-11 ENCOUNTER — Ambulatory Visit (INDEPENDENT_AMBULATORY_CARE_PROVIDER_SITE_OTHER): Payer: 59 | Admitting: Psychiatry

## 2018-06-11 DIAGNOSIS — F331 Major depressive disorder, recurrent, moderate: Secondary | ICD-10-CM | POA: Diagnosis not present

## 2018-06-11 DIAGNOSIS — G4733 Obstructive sleep apnea (adult) (pediatric): Secondary | ICD-10-CM

## 2018-06-11 DIAGNOSIS — F3289 Other specified depressive episodes: Secondary | ICD-10-CM

## 2018-06-11 MED ORDER — BUPROPION HCL ER (XL) 150 MG PO TB24: 450.0000 mg | ORAL_TABLET | Freq: Every day | ORAL | 1 refills | Status: DC

## 2018-06-11 MED ORDER — FLUOXETINE HCL 20 MG PO CAPS: 60.0000 mg | ORAL_CAPSULE | Freq: Every day | ORAL | 1 refills | Status: DC

## 2018-06-11 NOTE — Progress Notes (Signed)
[facility]  Patient ID: Theodore Miller,  MRN: [id number]  PCP: Colon Branch, MD  Date of Evaluation: 06/11/2018 Time spent:20 minutes  Chief Complaint:   HISTORY/CURRENT STATUS: HPI patient is doing the same he does have some depression but this does not bother him to a great deal.  Individual Medical History/ Review of Systems: Changes? :No   Allergies: Patient has no known allergies.  Current Medications:  Current Outpatient Medications:  .  ALPRAZolam (XANAX) 0.5 MG tablet, Take 1 tablet (0.5 mg total) by mouth 3 (three) times daily as needed for anxiety., Disp: 90 tablet, Rfl: 2 .  Armodafinil (NUVIGIL) 250 MG tablet, Take 1 tablet (250 mg total) by mouth daily., Disp: 30 tablet, Rfl: 2 .  buPROPion (WELLBUTRIN XL) 150 MG 24 hr tablet, Take 3 tablets (450 mg total) by mouth daily., Disp: 270 tablet, Rfl: 1 .  cholecalciferol (VITAMIN D) 1000 units tablet, Take 1,000 Units by mouth daily., Disp: , Rfl:  .  docusate sodium (COLACE) 100 MG capsule, Take 100 mg by mouth daily., Disp: , Rfl:  .  esomeprazole (NEXIUM) 40 MG capsule, Take 40 mg by mouth daily. , Disp: , Rfl:  .  ferrous sulfate 325 (65 FE) MG tablet, Take 325 mg by mouth daily with breakfast. , Disp: , Rfl:  .  FLUoxetine (PROZAC) 20 MG capsule, Take 3 capsules (60 mg total) by mouth daily., Disp: 270 capsule, Rfl: 1 .  levorphanol (LEVODROMORAN) 2 MG tablet, Take 2 mg by mouth 2 (two) times daily., Disp: , Rfl:  .  lisinopril (PRINIVIL,ZESTRIL) 5 MG tablet, Take 5 mg by mouth daily., Disp: , Rfl:  .  Oxycodone HCl 10 MG TABS, Take 10 mg by mouth 4 (four) times daily. , Disp: , Rfl:  .  rosuvastatin (CRESTOR) 20 MG tablet, Take 20 mg by mouth every evening., Disp: , Rfl:  .  topiramate (TOPAMAX) 50 MG tablet, Take 50 mg by mouth 2 (two) times daily., Disp: , Rfl:  .  vitamin C (ASCORBIC ACID) 500 MG tablet, Take 500 mg by mouth daily. , Disp: , Rfl:  No current facility-administered medications for this  visit.   Facility-Administered Medications Ordered in Other Visits:  .  ondansetron (ZOFRAN) 4 mg in sodium chloride 0.9 % 50 mL IVPB, 4 mg, Intravenous, Q6H PRN, Ashok Pall, MD Medication Side Effects: none  Family Medical/ Social History: Changes? No  MENTAL HEALTH EXAM:  There were no vitals taken for this visit.There is no height or weight on file to calculate BMI.  General Appearance: Casual  Eye Contact:  Good  Speech:  Clear and Coherent  Volume:  Normal  Mood:  Euthymic  Affect:  Appropriate  Thought Process:  Linear  Orientation:  Full (Time, Place, and Person)  Thought Content: Logical   Suicidal Thoughts:  No  Homicidal Thoughts:  No  Memory:  WNL  Judgement:  Good  Insight:  Good  Psychomotor Activity:  Normal  Concentration:  Concentration: Good  Recall:  Good  Fund of Knowledge: Good  Language: Good  Assets:  Resilience  ADL's:  Intact  Cognition: WNL  Prognosis:  Good    DIAGNOSES:    ICD-10-CM   1. Other depression F32.89   2. Obstructive sleep apnea syndrome G47.33   3. Major depressive disorder, recurrent episode, moderate (HCC) F33.1 buPROPion (WELLBUTRIN XL) 150 MG 24 hr tablet    FLUoxetine (PROZAC) 20 MG capsule    Receiving Psychotherapy: No  RECOMMENDATIONS: We will continue the patient on the same medications prescriptions were written for 90days E prescribe Wellbutrin and Prozac.  My identification for controlled substances is not working today so a handwritten Xanax 0.51 3 times daily as needed for 270 pills for 90 days.  Nuvigil 250 mg 1 a day #90. He is to return in 3 months   [employer], [place]

## 2018-08-16 ENCOUNTER — Other Ambulatory Visit: Payer: Self-pay

## 2018-08-16 DIAGNOSIS — F331 Major depressive disorder, recurrent, moderate: Secondary | ICD-10-CM

## 2018-08-16 MED ORDER — ARMODAFINIL 250 MG PO TABS: 250.0000 mg | ORAL_TABLET | Freq: Every day | ORAL | 1 refills | Status: DC

## 2018-09-10 ENCOUNTER — Ambulatory Visit: Payer: Medicare Other | Admitting: Psychiatry

## 2018-10-10 ENCOUNTER — Ambulatory Visit: Payer: 59 | Admitting: Psychiatry

## 2018-10-18 ENCOUNTER — Other Ambulatory Visit: Payer: Self-pay

## 2018-10-18 DIAGNOSIS — F331 Major depressive disorder, recurrent, moderate: Secondary | ICD-10-CM

## 2018-10-18 MED ORDER — ARMODAFINIL 250 MG PO TABS: 250.0000 mg | ORAL_TABLET | Freq: Every day | ORAL | 1 refills | Status: DC

## 2018-10-19 ENCOUNTER — Telehealth: Payer: Self-pay

## 2018-10-19 NOTE — Telephone Encounter (Signed)
Prior authorization submitted for Armodafinil 250 mg through [facility] approved effective 10/19/2018 - 10/19/2019

## 2018-12-05 ENCOUNTER — Other Ambulatory Visit: Payer: Self-pay

## 2018-12-05 DIAGNOSIS — F331 Major depressive disorder, recurrent, moderate: Secondary | ICD-10-CM

## 2018-12-05 MED ORDER — BUPROPION HCL ER (XL) 150 MG PO TB24: 450.0000 mg | ORAL_TABLET | Freq: Every day | ORAL | 0 refills | Status: DC

## 2018-12-05 MED ORDER — FLUOXETINE HCL 20 MG PO CAPS: 60.0000 mg | ORAL_CAPSULE | Freq: Every day | ORAL | 0 refills | Status: DC

## 2018-12-10 ENCOUNTER — Encounter: Payer: Self-pay | Admitting: Psychiatry

## 2018-12-10 ENCOUNTER — Ambulatory Visit (INDEPENDENT_AMBULATORY_CARE_PROVIDER_SITE_OTHER): Payer: 59 | Admitting: Psychiatry

## 2018-12-10 ENCOUNTER — Other Ambulatory Visit: Payer: Self-pay

## 2018-12-10 DIAGNOSIS — F331 Major depressive disorder, recurrent, moderate: Secondary | ICD-10-CM | POA: Diagnosis not present

## 2018-12-10 MED ORDER — FLUOXETINE HCL 20 MG PO CAPS: 60.0000 mg | ORAL_CAPSULE | Freq: Every day | ORAL | 1 refills | Status: DC

## 2018-12-10 MED ORDER — ARMODAFINIL 250 MG PO TABS: 250.0000 mg | ORAL_TABLET | Freq: Every day | ORAL | 5 refills | Status: DC

## 2018-12-10 MED ORDER — BUPROPION HCL ER (XL) 150 MG PO TB24: 450.0000 mg | ORAL_TABLET | Freq: Every day | ORAL | 1 refills | Status: DC

## 2018-12-10 NOTE — Progress Notes (Signed)
Theodore Miller [id number] Sep 27, 1950 68 y.o.  Subjective:   Patient ID:  Theodore Miller is a 68 y.o. (DOB [birth year]) male.  Chief Complaint:  Chief Complaint  Patient presents with  . Follow-up    h/o depression and anixety    HPI Theodore Miller presents to the office today for follow-up of depression. "Everything stays the same." Denies any significant depression. Reports anxiety is rare. He reports interrupted sleep. Reports difficulty falling asleep. "Sleep has always been a problem for me and continues to be." He reports that his energy is "not what it used to be" and reports that this may be related to pain and age. Motivation is low. Appetite is ok. He reports some difficulty with concentration and attention span is less. He reports that he can read a book and comprehend it. Reports that he is not as good about following instructions and doing things on the computer. Denies SI.   Enjoys babysitting grandchild and he will be one in September. Enjoys reading and watching history shows. No longer as interested in fishing. Reports that he tends not to leave the house very often, even prior to pandemic. Reports that he avoids people and going shopping.  Reports taking Xanax about twice a week.  Past Psychiatric Medication Trials: Prozac- Taken since 2009. Denies any change in response. Lexapro- anxiety Cymbalta Wellbutrin Nuvigil Xanax Lamictal Depakote ER Rozerem   Review of Systems:  Review of Systems  Cardiovascular: Negative for palpitations.  Musculoskeletal: Positive for back pain and neck pain. Negative for gait problem.  Neurological: Positive for tremors.  Hematological: Bruises/bleeds easily.  Psychiatric/Behavioral:       Please refer to HPI    Medications: I have reviewed the patient's current medications.  Current Outpatient Medications  Medication Sig Dispense Refill  . ALPRAZolam (XANAX) 0.5 MG tablet Take 1 tablet (0.5 mg total) by mouth 3 (three) times  daily as needed for anxiety. 90 tablet 2  . [START ON 12/18/2018] Armodafinil (NUVIGIL) 250 MG tablet Take 1 tablet (250 mg total) by mouth daily. 30 tablet 5  . buPROPion (WELLBUTRIN XL) 150 MG 24 hr tablet Take 3 tablets (450 mg total) by mouth daily. 270 tablet 1  . cholecalciferol (VITAMIN D) 1000 units tablet Take 1,000 Units by mouth daily.    Marland Kitchen docusate sodium (COLACE) 100 MG capsule Take 100 mg by mouth daily.    Marland Kitchen esomeprazole (NEXIUM) 40 MG capsule Take 40 mg by mouth daily.     . ferrous sulfate 325 (65 FE) MG tablet Take 325 mg by mouth daily with breakfast.     . FLUoxetine (PROZAC) 20 MG capsule Take 3 capsules (60 mg total) by mouth daily. 270 capsule 1  . levorphanol (LEVODROMORAN) 2 MG tablet Take 2 mg by mouth 2 (two) times daily.    Marland Kitchen lisinopril (PRINIVIL,ZESTRIL) 5 MG tablet Take 5 mg by mouth daily.    . Oxycodone HCl 10 MG TABS Take 10 mg by mouth 4 (four) times daily.     . rosuvastatin (CRESTOR) 20 MG tablet Take 20 mg by mouth every evening.    . topiramate (TOPAMAX) 50 MG tablet Take 50 mg by mouth 2 (two) times daily.    . vitamin C (ASCORBIC ACID) 500 MG tablet Take 500 mg by mouth daily.      No current facility-administered medications for this visit.    Facility-Administered Medications Ordered in Other Visits  Medication Dose Route Frequency Provider Last Rate Last Dose  .  ondansetron (ZOFRAN) 4 mg in sodium chloride 0.9 % 50 mL IVPB  4 mg Intravenous Q6H PRN Ashok Pall, MD        Medication Side Effects: None  Allergies: No Known Allergies  History reviewed. No pertinent past medical history.  History reviewed. No pertinent family history.  Social History   Socioeconomic History  . Marital status: Married    Spouse name: Not on file  . Number of children: Not on file  . Years of education: Not on file  . Highest education level: Not on file  Occupational History  . Not on file  Social Needs  . Financial resource strain: Not on file  . Food  insecurity    Worry: Not on file    Inability: Not on file  . Transportation needs    Medical: Not on file    Non-medical: Not on file  Tobacco Use  . Smoking status: Former Research scientist (life sciences)  . Smokeless tobacco: Never Used  Substance and Sexual Activity  . Alcohol use: Not on file  . Drug use: Not on file  . Sexual activity: Not on file  Lifestyle  . Physical activity    Days per week: Not on file    Minutes per session: Not on file  . Stress: Not on file  Relationships  . Social Herbalist on phone: Not on file    Gets together: Not on file    Attends religious service: Not on file    Active member of club or organization: Not on file    Attends meetings of clubs or organizations: Not on file    Relationship status: Not on file  . Intimate partner violence    Fear of current or ex partner: Not on file    Emotionally abused: Not on file    Physically abused: Not on file    Forced sexual activity: Not on file  Other Topics Concern  . Not on file  Social History Narrative  . Not on file    Past Medical History, Surgical history, Social history, and Family history were reviewed and updated as appropriate.   Please see review of systems for further details on the patient's review from today.   Objective:   Physical Exam:  BP 129/69   Pulse (!) 48   Physical Exam Constitutional:      General: He is not in acute distress.    Appearance: He is well-developed.  Musculoskeletal:        General: No deformity.  Neurological:     Mental Status: He is alert and oriented to person, place, and time.     Coordination: Coordination normal.  Psychiatric:        Attention and Perception: Attention and perception normal. He does not perceive auditory or visual hallucinations.        Mood and Affect: Mood is not anxious. Affect is blunt. Affect is not labile, angry or inappropriate.        Speech: Speech normal.        Behavior: Behavior normal.        Thought Content:  Thought content normal. Thought content does not include homicidal or suicidal ideation. Thought content does not include homicidal or suicidal plan.        Cognition and Memory: Cognition and memory normal.        Judgment: Judgment normal.     Comments: Mood presents as mildly depressed Insight intact. No delusions.  Lab Review:     Component Value Date/Time   NA 140 08/15/2007 0435   K 4.1 08/15/2007 0435   CL 101 08/15/2007 0435   CO2 34 (H) 08/15/2007 0435   GLUCOSE 86 08/15/2007 0435   BUN 12 DELTA CHECK NOTED 08/15/2007 0435   CREATININE 0.95 08/15/2007 0435   CALCIUM 9.3 08/15/2007 0435   PROT 6.8 08/14/2007 0300   ALBUMIN 3.8 08/14/2007 0300   AST 26 08/14/2007 0300   ALT 16 08/14/2007 0300   ALKPHOS 41 08/14/2007 0300   BILITOT 0.3 08/14/2007 0300   GFRNONAA >60 08/15/2007 0435   GFRAA  08/15/2007 0435    >60        The eGFR has been calculated using the MDRD equation. This calculation has not been validated in all clinical       Component Value Date/Time   WBC 3.7 (L) 08/14/2007 0300   RBC 3.88 (L) 08/14/2007 0300   HGB 12.6 (L) 08/14/2007 0300   HCT 36.0 (L) 08/14/2007 0300   PLT 134 (L) 08/14/2007 0300   MCV 92.9 08/14/2007 0300   MCHC 35.0 08/14/2007 0300   RDW 13.6 08/14/2007 0300   LYMPHSABS 1.6 08/14/2007 0300   MONOABS 0.4 08/14/2007 0300   EOSABS 0.1 08/14/2007 0300   BASOSABS 0.0 08/14/2007 0300    No results found for: POCLITH, LITHIUM   Lab Results  Component Value Date   VALPROATE 65.3 08/15/2007     .res Assessment: Plan:   Patient reports that he continues to have some mild depressive signs and symptoms, however he reports that he does not think mood, energy, or motivation would be better with change in medications since he attributes some of the symptoms to chronic pain and circumstances.  Will continue current plan of care.  Patient advised to contact office and schedule earlier appointment if signs and symptoms worsen and/or  he believes that a change in medication is indicated. Advised patient to contact pharmacy when refill and Xanax is needed since patient reports that he does not need Xanax at this time and consider controlled substance database indicates infrequent use. Patient to follow-up in 6 months or sooner if clinically indicated.  Medhansh was seen today for follow-up.  Diagnoses and all orders for this visit:  Major depressive disorder, recurrent episode, moderate (HCC) -     buPROPion (WELLBUTRIN XL) 150 MG 24 hr tablet; Take 3 tablets (450 mg total) by mouth daily. -     Armodafinil (NUVIGIL) 250 MG tablet; Take 1 tablet (250 mg total) by mouth daily. -     FLUoxetine (PROZAC) 20 MG capsule; Take 3 capsules (60 mg total) by mouth daily.     Please see After Visit Summary for patient specific instructions.  Future Appointments  Date Time Provider [place]  06/12/2019  9:00 AM Thayer Headings, PMHNP [facility] None    No orders of the defined types were placed in this encounter.   -------------------------------

## 2019-04-03 IMAGING — CT CT L SPINE W/ CM
3 series · 9 of 33 positions shown, 11 images · non-contrast
Comparison: none

CLINICAL DATA: Left-sided leg pain. Left-sided neck pain. Tingling
and weakness in both arms.
TECHNIQUE: Contiguous axial images were obtained through the Cervical and
Lumbar spine after the intrathecal infusion of infusion. Coronal and
sagittal reconstructions were obtained of the axial image sets.

[Series 5: l spine 2.0 st · axial · 0.33mm/px · z∈[-1042,-1042]mm · 1 of 143 slices shown, 2 images]
[im 77/143  soft-tissue]
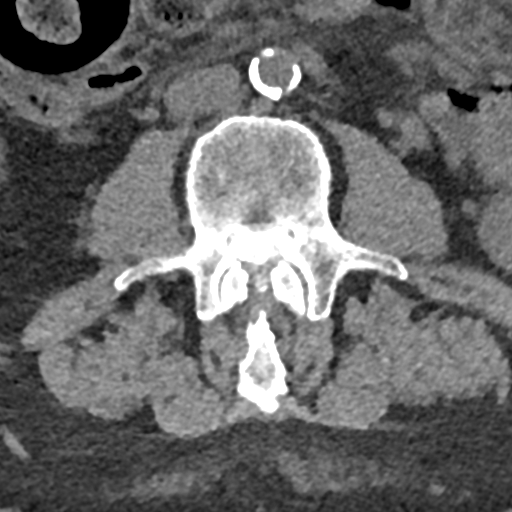
[im 77/143  bone]
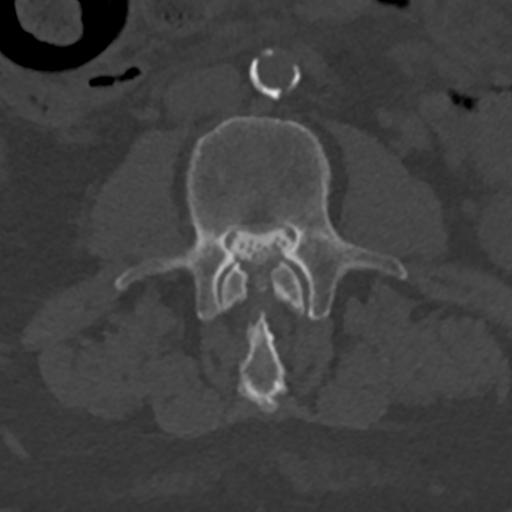

[Series 8: coronal st · coronal · 0.36mm/px · 3 of 74 slices shown]
[im 15/74  bone]
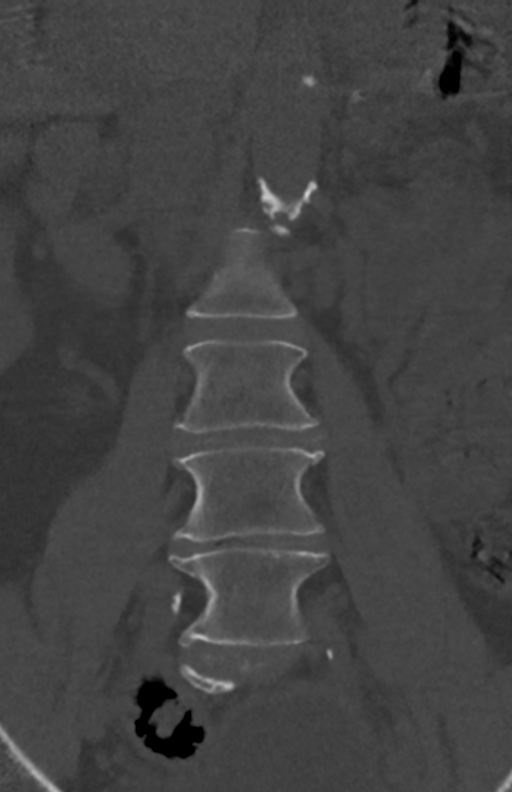
[im 30/74  bone]
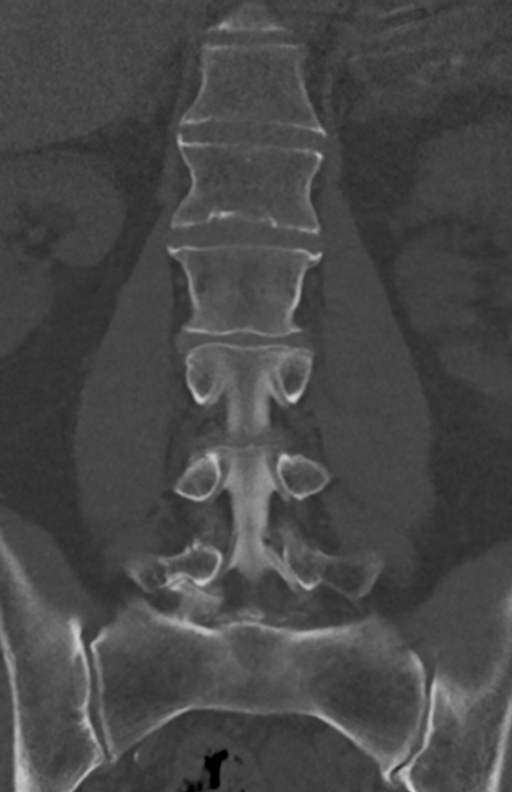
[im 44/74  bone]
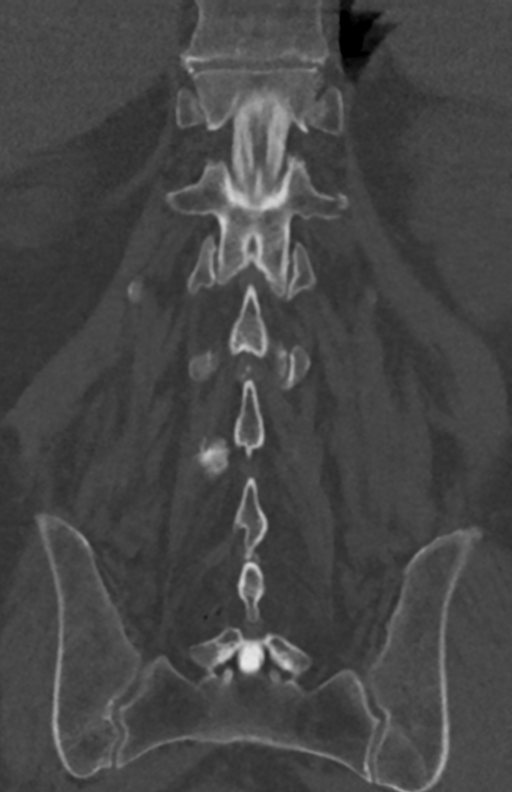

[Series 9: sagittal st · sagittal · 0.32mm/px · 5 of 61 slices shown, 6 images]
[im 21/61  bone]
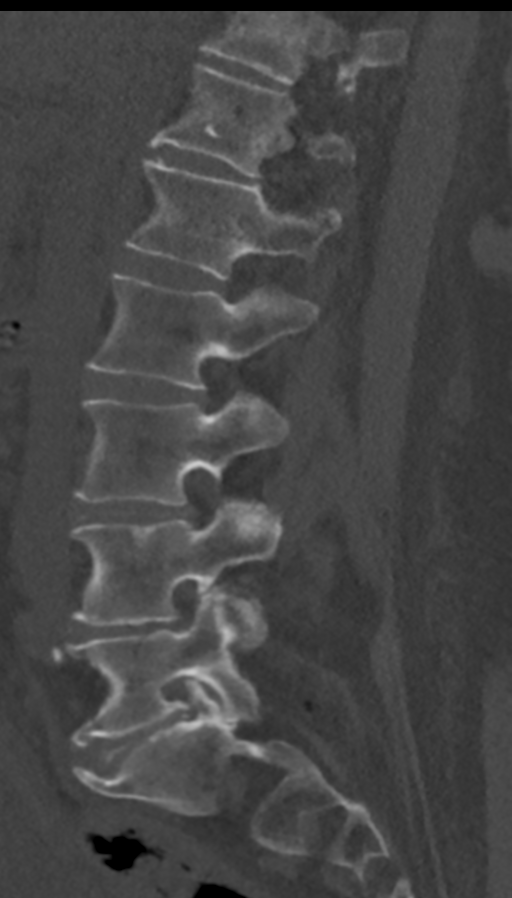
[im 26/61  bone]
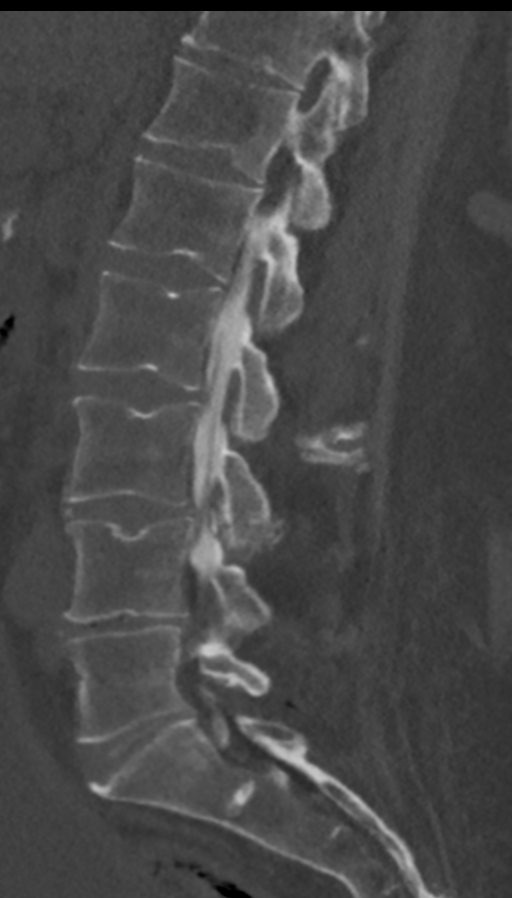
[im 31/61  soft-tissue]
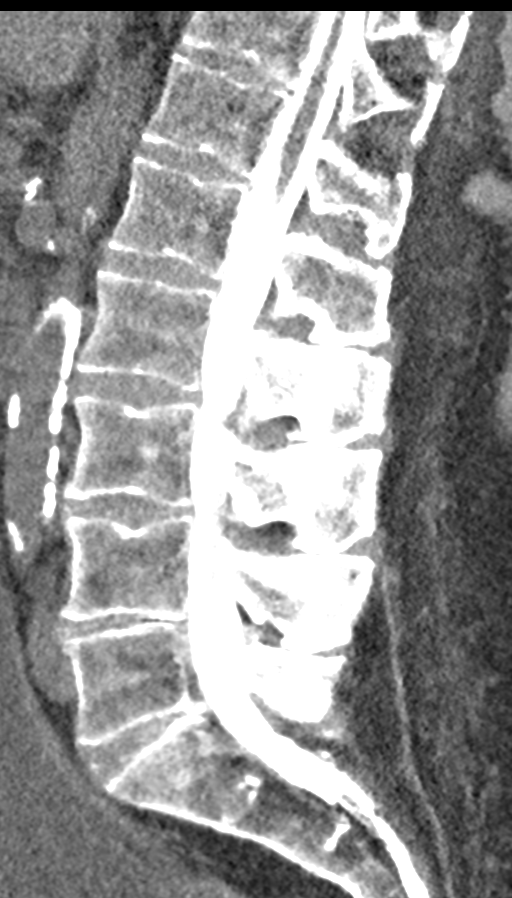
[im 31/61  bone]
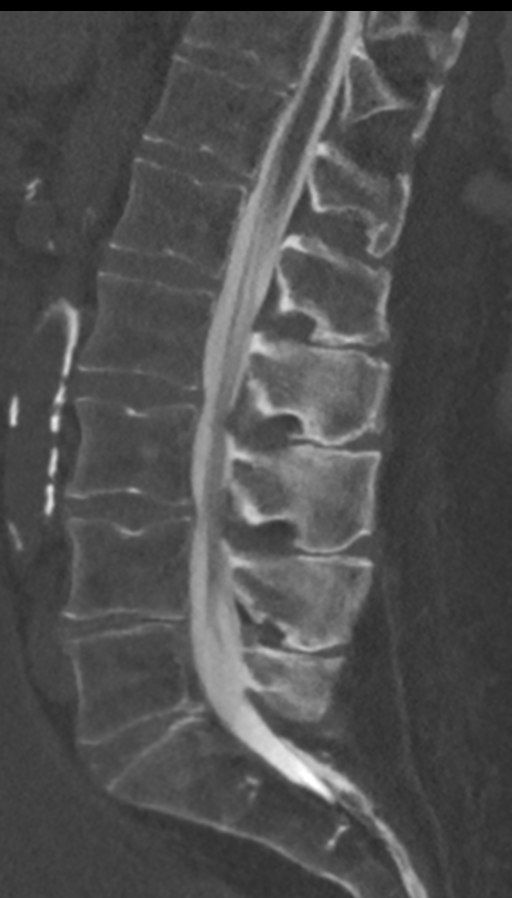
[im 36/61  bone]
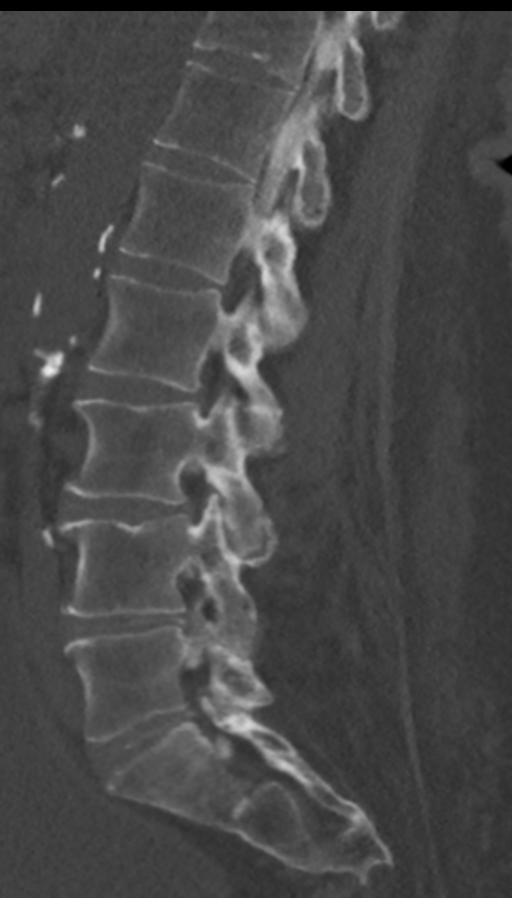
[im 41/61  bone]
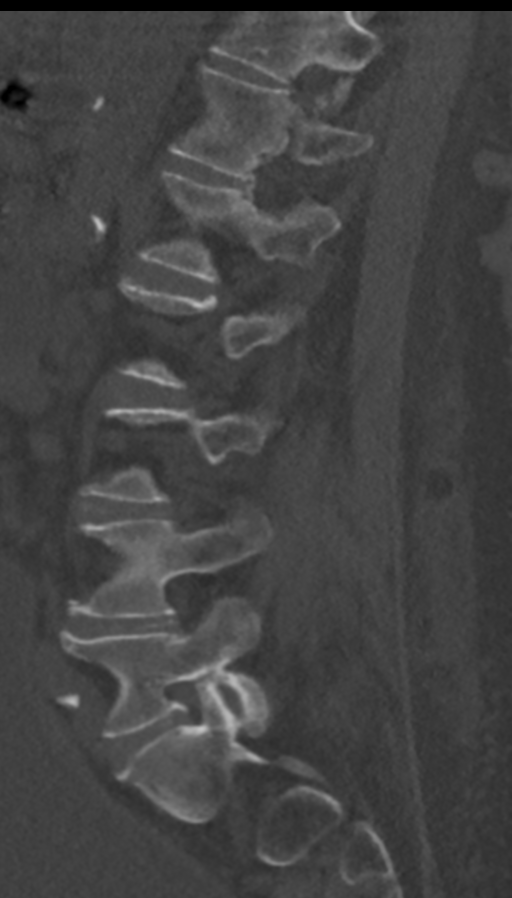

[9 of 33 positions shown; findings below may reference images not displayed]

FLUOROSCOPY TIME:  2 minutes 30 seconds

PROCEDURE:
LUMBAR PUNCTURE FOR CERVICAL AND LUMBAR MYELOGRAM

CERVICAL AND LUMBAR MYELOGRAM

CT CERVICAL MYELOGRAM

CT LUMBAR MYELOGRAM

Lumbar puncture and intrathecal contrast administration were
performed by Dr Yulie who will separately report for the portion
of the procedure. I personally supervised acquisition of the
myelogram images.
FINDINGS: CERVICAL AND LUMBAR MYELOGRAM FINDINGS:

Limited positioning due to patient's fixed kyphosis and due to
discomfort.

On lumbar imaging there is a extra dural defect impinging on the
left S1 nerve root. Ventral disc bulges in the lumbar spine without
high-grade spinal stenosis.

The nerve roots are free-floating.  No conus abnormality.

With standing there is mild dextrocurvature centered at L1-2.

Cervical myelography was only performed to determine there was
adequate contrast in the cervical canal. Due to kyphosis typical
filming was not possible.

CT CERVICAL MYELOGRAM FINDINGS:

Alignment: Reversal of cervical lordosis due to degenerative
disease.

Vertebrae: No fracture, discitis, or aggressive bone lesion.

Cord: Normal morphology.  No abnormal intrathecal filling defect.

Extra-spinal: Negative

Disc levels:

C2-3: Spondylosis and disc narrowing. Mild facet spurring greater on
the right. No impingement

C3-4: Spondylosis and disc narrowing with asymmetric right
uncovertebral spurring. Mild facet spurring greater on the right.
Advanced right foraminal impingement. Mild left foraminal narrowing.
The canal is patent

C4-5: Disc narrowing with asymmetric right uncovertebral spurring.
Asymmetric right facet spurring. Severe right foraminal stenosis.
Patent canal and left foramen.

C5-6: Spondylosis with bridging osteophyte. The disc is narrowed
with a central protrusion contacting the ventral cord without
compression. Negative facets. No impingement.

C6-7: Disc narrowing with endplate and uncovertebral spurring. Mild
left and moderate right foraminal narrowing. Canal is patent

C7-T1:Spondylosis and disc narrowing with central protrusion. The
canal and foramina is patent.

CT LUMBAR MYELOGRAM FINDINGS:

Segmentation: 5 lumbar type vertebral bodies based on the lowest
ribs.

Alignment: Negative for listhesis.

Vertebrae: No fracture, discitis, or aggressive bone lesion.

Extra-spinal: Distended urinary bladder that appears to have a thick
wall posteriorly.

Conus: Tip terminates at T12-L1. Normal signal. Normal free-flowing
appearance of the cauda equina.

Disc levels:

T12- L1: Small right-sided endplate ridge.  No impingement

L1-L2: Unremarkable.

L2-L3: Unremarkable.

L3-L4: Posterior element hypertrophy that is moderate. The disc is
narrowed with mild bulging. There is mild spinal stenosis and
subarticular recess narrowing without compressive stenosis.

L4-L5: Facet spurring and disc narrowing with no noted impingement.

L5-S1:Disc narrowing with endplate spurring. Facet spurring
asymmetric to the right. Noncompressive foraminal narrowing
bilaterally.
IMPRESSION: Cervical spine:

1. Degenerative disease with kyphosis centered at C6.
2. Disc height loss and uncovertebral spurring causes foraminal
impingement on the right at C3-4, right at C4-5, and to a moderate
degree on the right at C6-7.
3. No significant left-sided stenosis.
4. Small disc protrusions at C5-6 and C7-T1 without cord
compression. The canal is diffusely patent.

Lumbar spine:

1. Generalized degenerative disc narrowing with facet spurring.
2. Diffusely patent canal with mild spinal stenosis at L3-4.
3. Noncompressive bilateral foraminal narrowing at L5-S1.
4. Dilated bladder with posterior wall thickening, also seen in
[id number], compatible with chronic outlet obstruction.

## 2019-06-06 ENCOUNTER — Other Ambulatory Visit: Payer: Self-pay | Admitting: Psychiatry

## 2019-06-06 DIAGNOSIS — F331 Major depressive disorder, recurrent, moderate: Secondary | ICD-10-CM

## 2019-06-12 ENCOUNTER — Other Ambulatory Visit: Payer: Self-pay

## 2019-06-12 ENCOUNTER — Encounter: Payer: Self-pay | Admitting: Psychiatry

## 2019-06-12 ENCOUNTER — Ambulatory Visit (INDEPENDENT_AMBULATORY_CARE_PROVIDER_SITE_OTHER): Payer: No Typology Code available for payment source | Admitting: Psychiatry

## 2019-06-12 VITALS — BP 147/81 | HR 86

## 2019-06-12 DIAGNOSIS — F331 Major depressive disorder, recurrent, moderate: Secondary | ICD-10-CM | POA: Diagnosis not present

## 2019-06-12 DIAGNOSIS — G4733 Obstructive sleep apnea (adult) (pediatric): Secondary | ICD-10-CM

## 2019-06-12 MED ORDER — ARIPIPRAZOLE 5 MG PO TABS: ORAL_TABLET | ORAL | 1 refills | Status: DC

## 2019-06-12 MED ORDER — FLUOXETINE HCL 20 MG PO CAPS: 60.0000 mg | ORAL_CAPSULE | Freq: Every day | ORAL | 0 refills | Status: DC

## 2019-06-12 MED ORDER — BUPROPION HCL ER (XL) 150 MG PO TB24: 450.0000 mg | ORAL_TABLET | Freq: Every day | ORAL | 1 refills | Status: DC

## 2019-06-12 MED ORDER — ALPRAZOLAM 0.5 MG PO TABS: 0.5000 mg | ORAL_TABLET | Freq: Three times a day (TID) | ORAL | 2 refills | Status: DC | PRN

## 2019-06-12 MED ORDER — ARMODAFINIL 250 MG PO TABS: 250.0000 mg | ORAL_TABLET | Freq: Every day | ORAL | 5 refills | Status: DC

## 2019-06-12 NOTE — Progress Notes (Signed)
Theodore Miller [id number] 1950-09-10 69 y.o.  Subjective:   Patient ID:  Theodore Miller is a 69 y.o. (DOB 03-13-1951) male.  Chief Complaint:  Chief Complaint  Patient presents with  . Follow-up    Depression, insomnia, and Anxiety    HPI Theodore Miller presents to the office today for follow-up of depression. HE reports that his mood is about "the same." He reports that he has difficulty leaving the house. He reports that he usually enjoys fishing and did not get his boat out all summer. Anhedonia and decreased interest. Describes mood as "more just not happy." He reports some worry about certain events, like leading up to the holidays. Reports that he has been socially withdrawn. He reports occ mild irritability. He reports poor sleep that is fragmented. Reports sleeping in 2 hour intervals. Appetite is unchanged. Motivation has been low- does not care to go on the computer, learn anything new. Energy has been low. Will go outside and work and prefers this. Continues to read frequently. Reports diminished concentration and has difficulty recalling what he has read or following instructions with several steps. Denies SI.    He is expecting his 2nd grandchild Sunday. Also has a 1.5 yo grandchild that they babysit for.   Past Psychiatric Medication Trials: Prozac- Taken since 2009. Denies any change in response. Lexapro- anxiety Cymbalta Wellbutrin Nuvigil Xanax Lamictal Depakote ER Rozerem Gabapentin- Prescribed for neuropathy  Review of Systems:  Review of Systems  Musculoskeletal: Negative for gait problem.  Neurological: Positive for tremors.  Psychiatric/Behavioral:       Please refer to HPI    Medications:   Current Outpatient Medications  Medication Sig Dispense Refill  . ALPRAZolam (XANAX) 0.5 MG tablet Take 1 tablet (0.5 mg total) by mouth 3 (three) times daily as needed for anxiety. 90 tablet 2  . Armodafinil (NUVIGIL) 250 MG tablet Take 1 tablet (250 mg total) by  mouth daily. 30 tablet 5  . buPROPion (WELLBUTRIN XL) 150 MG 24 hr tablet Take 3 tablets (450 mg total) by mouth daily. 270 tablet 1  . cholecalciferol (VITAMIN D) 1000 units tablet Take 1,000 Units by mouth daily.    Marland Kitchen docusate sodium (COLACE) 100 MG capsule Take 100 mg by mouth daily.    Marland Kitchen esomeprazole (NEXIUM) 40 MG capsule Take 40 mg by mouth daily.     . ferrous sulfate 325 (65 FE) MG tablet Take 325 mg by mouth daily with breakfast.     . FLUoxetine (PROZAC) 20 MG capsule Take 3 capsules (60 mg total) by mouth daily. 270 capsule 0  . gabapentin (NEURONTIN) 100 MG capsule Take 100 mg by mouth 2 (two) times daily.    Marland Kitchen levorphanol (LEVODROMORAN) 2 MG tablet Take 2 mg by mouth 2 (two) times daily.    Marland Kitchen lisinopril (PRINIVIL,ZESTRIL) 5 MG tablet Take 5 mg by mouth daily.    . Oxycodone HCl 10 MG TABS Take 10 mg by mouth 4 (four) times daily.     . rosuvastatin (CRESTOR) 20 MG tablet Take 20 mg by mouth every evening.    . topiramate (TOPAMAX) 50 MG tablet Take 50 mg by mouth 2 (two) times daily.    . vitamin C (ASCORBIC ACID) 500 MG tablet Take 500 mg by mouth daily.     . ARIPiprazole (ABILIFY) 5 MG tablet Take 1/2 tab po qd x 1 week, then may increase to 1 tab po qd as tolerated 30 tablet 1   No current facility-administered  medications for this visit.   Facility-Administered Medications Ordered in Other Visits  Medication Dose Route Frequency Provider Last Rate Last Admin  . ondansetron (ZOFRAN) 4 mg in sodium chloride 0.9 % 50 mL IVPB  4 mg Intravenous Q6H PRN Ashok Pall, MD        Medication Side Effects: None  Allergies: No Known Allergies  Past Medical History:  Diagnosis Date  . Neuropathy     History reviewed. No pertinent family history.  Social History   Socioeconomic History  . Marital status: Married    Spouse name: Not on file  . Number of children: Not on file  . Years of education: Not on file  . Highest education level: Not on file  Occupational History   . Not on file  Tobacco Use  . Smoking status: Former Research scientist (life sciences)  . Smokeless tobacco: Never Used  Substance and Sexual Activity  . Alcohol use: Not on file  . Drug use: Not on file  . Sexual activity: Not on file  Other Topics Concern  . Not on file  Social History Narrative  . Not on file   Social Determinants of Health   Financial Resource Strain:   . Difficulty of Paying Living Expenses: Not on file  Food Insecurity:   . Worried About Charity fundraiser in the Last Year: Not on file  . Ran Out of Food in the Last Year: Not on file  Transportation Needs:   . Lack of Transportation (Medical): Not on file  . Lack of Transportation (Non-Medical): Not on file  Physical Activity:   . Days of Exercise per Week: Not on file  . Minutes of Exercise per Session: Not on file  Stress:   . Feeling of Stress : Not on file  Social Connections:   . Frequency of Communication with Friends and Family: Not on file  . Frequency of Social Gatherings with Friends and Family: Not on file  . Attends Religious Services: Not on file  . Active Member of Clubs or Organizations: Not on file  . Attends Archivist Meetings: Not on file  . Marital Status: Not on file  Intimate Partner Violence:   . Fear of Current or Ex-Partner: Not on file  . Emotionally Abused: Not on file  . Physically Abused: Not on file  . Sexually Abused: Not on file    Past Medical History, Surgical history, Social history, and Family history were reviewed and updated as appropriate.   Please see review of systems for further details on the patient's review from today.   Objective:   Physical Exam:  BP (!) 147/81   Pulse 86   Physical Exam Constitutional:      General: He is not in acute distress.    Appearance: He is well-developed.  Musculoskeletal:        General: No deformity.  Neurological:     Mental Status: He is alert and oriented to person, place, and time.     Coordination: Coordination normal.   Psychiatric:        Attention and Perception: Attention and perception normal. He does not perceive auditory or visual hallucinations.        Mood and Affect: Mood is depressed. Mood is not anxious. Affect is blunt. Affect is not labile, angry or inappropriate.        Speech: Speech normal.        Behavior: Behavior normal.        Thought Content: Thought content  normal. Thought content is not paranoid or delusional. Thought content does not include homicidal or suicidal ideation. Thought content does not include homicidal or suicidal plan.        Cognition and Memory: Cognition and memory normal.        Judgment: Judgment normal.     Comments: Insight intact     Lab Review:     Component Value Date/Time   NA 140 08/15/2007 0435   K 4.1 08/15/2007 0435   CL 101 08/15/2007 0435   CO2 34 (H) 08/15/2007 0435   GLUCOSE 86 08/15/2007 0435   BUN 12 DELTA CHECK NOTED 08/15/2007 0435   CREATININE 0.95 08/15/2007 0435   CALCIUM 9.3 08/15/2007 0435   PROT 6.8 08/14/2007 0300   ALBUMIN 3.8 08/14/2007 0300   AST 26 08/14/2007 0300   ALT 16 08/14/2007 0300   ALKPHOS 41 08/14/2007 0300   BILITOT 0.3 08/14/2007 0300   GFRNONAA >60 08/15/2007 0435   GFRAA  08/15/2007 0435    >60        The eGFR has been calculated using the MDRD equation. This calculation has not been validated in all clinical       Component Value Date/Time   WBC 3.7 (L) 08/14/2007 0300   RBC 3.88 (L) 08/14/2007 0300   HGB 12.6 (L) 08/14/2007 0300   HCT 36.0 (L) 08/14/2007 0300   PLT 134 (L) 08/14/2007 0300   MCV 92.9 08/14/2007 0300   MCHC 35.0 08/14/2007 0300   RDW 13.6 08/14/2007 0300   LYMPHSABS 1.6 08/14/2007 0300   MONOABS 0.4 08/14/2007 0300   EOSABS 0.1 08/14/2007 0300   BASOSABS 0.0 08/14/2007 0300    No results found for: POCLITH, LITHIUM   Lab Results  Component Value Date   VALPROATE 65.3 08/15/2007     .res Assessment: Plan:   Patient seen for 30 minutes and discussed potential benefits,  risks, and side effects of augmentation of depression with Abilify. Discussed potential metabolic side effects associated with atypical antipsychotics, as well as potential risk for movement side effects. Advised pt to contact office if movement side effects occur.  Patient agrees to trial of Abilify. Will continue all other medications as prescribed. Pt to f/u in 4 weeks or sooner if clinically indicated.  Patient advised to contact office with any questions, adverse effects, or acute worsening in signs and symptoms.   Theodore Miller was seen today for follow-up.  Diagnoses and all orders for this visit:  Obstructive sleep apnea syndrome -     Armodafinil (NUVIGIL) 250 MG tablet; Take 1 tablet (250 mg total) by mouth daily.  Major depressive disorder, recurrent episode, moderate (HCC) -     ALPRAZolam (XANAX) 0.5 MG tablet; Take 1 tablet (0.5 mg total) by mouth 3 (three) times daily as needed for anxiety. -     Armodafinil (NUVIGIL) 250 MG tablet; Take 1 tablet (250 mg total) by mouth daily. -     buPROPion (WELLBUTRIN XL) 150 MG 24 hr tablet; Take 3 tablets (450 mg total) by mouth daily. -     FLUoxetine (PROZAC) 20 MG capsule; Take 3 capsules (60 mg total) by mouth daily. -     ARIPiprazole (ABILIFY) 5 MG tablet; Take 1/2 tab po qd x 1 week, then may increase to 1 tab po qd as tolerated     Please see After Visit Summary for patient specific instructions.  No future appointments.  No orders of the defined types were placed in this encounter.   -------------------------------

## 2019-07-09 ENCOUNTER — Telehealth: Payer: Self-pay | Admitting: Psychiatry

## 2019-07-09 NOTE — Telephone Encounter (Signed)
PT is not on new med from visit 1/6, Cancelled 2/3 appt. When should he return for regular follow-up?

## 2019-07-10 ENCOUNTER — Ambulatory Visit: Payer: Medicare Other | Admitting: Psychiatry

## 2019-07-10 ENCOUNTER — Other Ambulatory Visit: Payer: Self-pay | Admitting: Psychiatry

## 2019-07-10 DIAGNOSIS — F331 Major depressive disorder, recurrent, moderate: Secondary | ICD-10-CM

## 2019-07-12 ENCOUNTER — Telehealth: Payer: Self-pay | Admitting: Psychiatry

## 2019-07-12 ENCOUNTER — Other Ambulatory Visit: Payer: Self-pay

## 2019-07-12 DIAGNOSIS — F331 Major depressive disorder, recurrent, moderate: Secondary | ICD-10-CM

## 2019-07-12 DIAGNOSIS — G4733 Obstructive sleep apnea (adult) (pediatric): Secondary | ICD-10-CM

## 2019-07-12 MED ORDER — ARMODAFINIL 250 MG PO TABS: 250.0000 mg | ORAL_TABLET | Freq: Every day | ORAL | 1 refills | Status: DC

## 2019-07-12 NOTE — Telephone Encounter (Signed)
Patient called and said that insurance is asking for a 90 day supply of his armodafinil to be sent to the pharmacy.He has an appt on  2/8 but is out of his medicine. So [facility] on [address]

## 2019-07-12 NOTE — Telephone Encounter (Signed)
90 day wit 1 refill pended for Theodore Miller to submit

## 2019-07-12 NOTE — Telephone Encounter (Signed)
Theodore Miller called back today to report again that he is not taking the new medication and cancelled his appt 07/15/19 and RS to July which would be his regular 6 month follow up.

## 2019-07-15 ENCOUNTER — Ambulatory Visit: Payer: Medicare Other | Admitting: Psychiatry

## 2019-12-12 ENCOUNTER — Ambulatory Visit: Payer: Medicare Other | Admitting: Psychiatry

## 2019-12-19 ENCOUNTER — Ambulatory Visit (INDEPENDENT_AMBULATORY_CARE_PROVIDER_SITE_OTHER): Payer: Medicare Other | Admitting: Psychiatry

## 2019-12-19 ENCOUNTER — Other Ambulatory Visit: Payer: Self-pay

## 2019-12-19 ENCOUNTER — Encounter: Payer: Self-pay | Admitting: Psychiatry

## 2019-12-19 DIAGNOSIS — F331 Major depressive disorder, recurrent, moderate: Secondary | ICD-10-CM

## 2019-12-19 DIAGNOSIS — G4733 Obstructive sleep apnea (adult) (pediatric): Secondary | ICD-10-CM | POA: Diagnosis not present

## 2019-12-19 MED ORDER — FLUOXETINE HCL 20 MG PO CAPS: 60.0000 mg | ORAL_CAPSULE | Freq: Every day | ORAL | 1 refills | Status: DC

## 2019-12-19 MED ORDER — ARMODAFINIL 250 MG PO TABS: 250.0000 mg | ORAL_TABLET | Freq: Every day | ORAL | 1 refills | Status: DC

## 2019-12-19 MED ORDER — ALPRAZOLAM 0.5 MG PO TABS: 0.5000 mg | ORAL_TABLET | Freq: Three times a day (TID) | ORAL | 0 refills | Status: DC | PRN

## 2019-12-19 MED ORDER — BUPROPION HCL ER (XL) 150 MG PO TB24: 450.0000 mg | ORAL_TABLET | Freq: Every day | ORAL | 1 refills | Status: DC

## 2019-12-19 NOTE — Progress Notes (Signed)
Theodore Miller [id number] [birth year] 69 y.o.  Subjective:   Patient ID:  Theodore Miller is a 69 y.o. (DOB December 18, 1950) male.  Chief Complaint:  Chief Complaint  Patient presents with  . Depression    HPI Theodore Miller presents to the office today for follow-up of depression. He reports that his mood has improved some. Has been fishing some. He reports some continued depression. He reports that increased sunlight has been helpful for his mood. He has been pushing himself to get out more and go to the grocery store. Going to the [employer]. He reports occasional anxiety and will use Xanax prn. Estimates taking about 10 Xanax prn since last visit. Reports that he has increased anxiety when they are having large gatherings or when he has to be around people he does not care for. He reports that he does "not sleep well and never have." He reports that back pain radiates down his leg and will wake up with his legs throbbing. He reports that he may have some RLS and has to move around at night and has been told he kicks in his sleep. Denies nightmares. Appetite has been ok. Energy is ok. He reports motivation has improved. Denies anhedonia. He reports difficulty with concentration and focus, such as following directions. Denies SI.   He reports that his wife thought he was more irritable with Abilify and he therefore stopped Abilify.   Has not been able to go to [place] in 2 years and would previously get together with his brothers. Taking care of 2 grandchildren (6 months ols and almost 12 years old) during the day.   Past Psychiatric Medication Trials: Prozac- Taken since 2009. Denies any change in response. Lexapro- anxiety Cymbalta Wellbutrin Nuvigil Xanax Lamictal Depakote ER Rozerem Gabapentin- Prescribed for neuropathy Abilify- Reportedly more irritability    Review of Systems:  Review of Systems  Musculoskeletal: Positive for back pain and neck pain. Negative for gait  problem.  Neurological:       Neuropathy  Psychiatric/Behavioral:       Please refer to HPI    Medications: I have reviewed the patient's current medications.  Current Outpatient Medications  Medication Sig Dispense Refill  . ALPRAZolam (XANAX) 0.5 MG tablet Take 1 tablet (0.5 mg total) by mouth 3 (three) times daily as needed for anxiety. 90 tablet 0  . Armodafinil (NUVIGIL) 250 MG tablet Take 1 tablet (250 mg total) by mouth daily. 90 tablet 1  . buPROPion (WELLBUTRIN XL) 150 MG 24 hr tablet Take 3 tablets (450 mg total) by mouth daily. 270 tablet 1  . cholecalciferol (VITAMIN D) 1000 units tablet Take 1,000 Units by mouth daily.    Marland Kitchen docusate sodium (COLACE) 100 MG capsule Take 100 mg by mouth daily.    Marland Kitchen esomeprazole (NEXIUM) 40 MG capsule Take 40 mg by mouth daily.     . ferrous sulfate 325 (65 FE) MG tablet Take 325 mg by mouth daily with breakfast.     . FLUoxetine (PROZAC) 20 MG capsule Take 3 capsules (60 mg total) by mouth daily. 270 capsule 1  . gabapentin (NEURONTIN) 300 MG capsule Take by mouth.    Marland Kitchen lisinopril (PRINIVIL,ZESTRIL) 5 MG tablet Take 5 mg by mouth daily.    . Oxycodone HCl 10 MG TABS Take 10 mg by mouth 4 (four) times daily.     . rosuvastatin (CRESTOR) 20 MG tablet Take 20 mg by mouth every evening.    . topiramate (TOPAMAX) 50  MG tablet Take 50 mg by mouth 2 (two) times daily.    . vitamin C (ASCORBIC ACID) 500 MG tablet Take 500 mg by mouth daily.     Marland Kitchen XTAMPZA ER 13.5 MG C12A Take 1 capsule by mouth 2 (two) times daily.     No current facility-administered medications for this visit.   Facility-Administered Medications Ordered in Other Visits  Medication Dose Route Frequency Provider Last Rate Last Admin  . ondansetron (ZOFRAN) 4 mg in sodium chloride 0.9 % 50 mL IVPB  4 mg Intravenous Q6H PRN Ashok Pall, MD        Medication Side Effects: None  Allergies: No Known Allergies  Past Medical History:  Diagnosis Date  . Neuropathy     History  reviewed. No pertinent family history.  Social History   Socioeconomic History  . Marital status: Married    Spouse name: Not on file  . Number of children: Not on file  . Years of education: Not on file  . Highest education level: Not on file  Occupational History  . Not on file  Tobacco Use  . Smoking status: Former Research scientist (life sciences)  . Smokeless tobacco: Never Used  Substance and Sexual Activity  . Alcohol use: Not on file  . Drug use: Not on file  . Sexual activity: Not on file  Other Topics Concern  . Not on file  Social History Narrative  . Not on file   Social Determinants of Health   Financial Resource Strain:   . Difficulty of Paying Living Expenses:   Food Insecurity:   . Worried About Charity fundraiser in the Last Year:   . Arboriculturist in the Last Year:   Transportation Needs:   . Film/video editor (Medical):   Marland Kitchen Lack of Transportation (Non-Medical):   Physical Activity:   . Days of Exercise per Week:   . Minutes of Exercise per Session:   Stress:   . Feeling of Stress :   Social Connections:   . Frequency of Communication with Friends and Family:   . Frequency of Social Gatherings with Friends and Family:   . Attends Religious Services:   . Active Member of Clubs or Organizations:   . Attends Archivist Meetings:   Marland Kitchen Marital Status:   Intimate Partner Violence:   . Fear of Current or Ex-Partner:   . Emotionally Abused:   Marland Kitchen Physically Abused:   . Sexually Abused:     Past Medical History, Surgical history, Social history, and Family history were reviewed and updated as appropriate.   Please see review of systems for further details on the patient's review from today.   Objective:   Physical Exam:  Wt 198 lb (89.8 kg)   BMI 30.11 kg/m   Physical Exam Constitutional:      General: He is not in acute distress. Musculoskeletal:        General: No deformity.  Neurological:     Mental Status: He is alert and oriented to person,  place, and time.     Coordination: Coordination normal.  Psychiatric:        Attention and Perception: Attention and perception normal. He does not perceive auditory or visual hallucinations.        Mood and Affect: Mood is not anxious. Affect is not labile, blunt, angry or inappropriate.        Speech: Speech normal.        Behavior: Behavior normal.  Thought Content: Thought content normal. Thought content is not paranoid or delusional. Thought content does not include homicidal or suicidal ideation. Thought content does not include homicidal or suicidal plan.        Cognition and Memory: Cognition and memory normal.        Judgment: Judgment normal.     Comments: Insight intact Mood presents as less depressed compared to last exam     Lab Review:     Component Value Date/Time   NA 140 08/15/2007 0435   K 4.1 08/15/2007 0435   CL 101 08/15/2007 0435   CO2 34 (H) 08/15/2007 0435   GLUCOSE 86 08/15/2007 0435   BUN 12 DELTA CHECK NOTED 08/15/2007 0435   CREATININE 0.95 08/15/2007 0435   CALCIUM 9.3 08/15/2007 0435   PROT 6.8 08/14/2007 0300   ALBUMIN 3.8 08/14/2007 0300   AST 26 08/14/2007 0300   ALT 16 08/14/2007 0300   ALKPHOS 41 08/14/2007 0300   BILITOT 0.3 08/14/2007 0300   GFRNONAA >60 08/15/2007 0435   GFRAA  08/15/2007 0435    >60        The eGFR has been calculated using the MDRD equation. This calculation has not been validated in all clinical       Component Value Date/Time   WBC 3.7 (L) 08/14/2007 0300   RBC 3.88 (L) 08/14/2007 0300   HGB 12.6 (L) 08/14/2007 0300   HCT 36.0 (L) 08/14/2007 0300   PLT 134 (L) 08/14/2007 0300   MCV 92.9 08/14/2007 0300   MCHC 35.0 08/14/2007 0300   RDW 13.6 08/14/2007 0300   LYMPHSABS 1.6 08/14/2007 0300   MONOABS 0.4 08/14/2007 0300   EOSABS 0.1 08/14/2007 0300   BASOSABS 0.0 08/14/2007 0300    No results found for: POCLITH, LITHIUM   Lab Results  Component Value Date   VALPROATE 65.3 08/15/2007      .res Assessment: Plan:   Pt reports that he would like to continue current medications without changes since mood has improved and he is tolerating current medications without any significant adverse effects.  Pt to f/u in 6 months or sooner if clinically indicated.  Patient advised to contact office with any questions, adverse effects, or acute worsening in signs and symptoms.  Adit was seen today for depression.  Diagnoses and all orders for this visit:  Major depressive disorder, recurrent episode, moderate (HCC) -     Armodafinil (NUVIGIL) 250 MG tablet; Take 1 tablet (250 mg total) by mouth daily. -     ALPRAZolam (XANAX) 0.5 MG tablet; Take 1 tablet (0.5 mg total) by mouth 3 (three) times daily as needed for anxiety. -     buPROPion (WELLBUTRIN XL) 150 MG 24 hr tablet; Take 3 tablets (450 mg total) by mouth daily. -     FLUoxetine (PROZAC) 20 MG capsule; Take 3 capsules (60 mg total) by mouth daily.  Obstructive sleep apnea syndrome -     Armodafinil (NUVIGIL) 250 MG tablet; Take 1 tablet (250 mg total) by mouth daily.     Please see After Visit Summary for patient specific instructions.  Future Appointments  Date Time Provider [place]  06/19/2020 10:00 AM Thayer Headings, PMHNP [facility] None    No orders of the defined types were placed in this encounter.   -------------------------------

## 2020-02-21 ENCOUNTER — Other Ambulatory Visit: Payer: Self-pay | Admitting: Nurse Practitioner

## 2020-02-21 DIAGNOSIS — M47812 Spondylosis without myelopathy or radiculopathy, cervical region: Secondary | ICD-10-CM

## 2020-03-14 ENCOUNTER — Ambulatory Visit
Admission: RE | Admit: 2020-03-14 | Discharge: 2020-03-14 | Disposition: A | Discharge status: Home / Self Care | Payer: Medicare Other | Source: Ambulatory Visit | Attending: Nurse Practitioner | Admitting: Nurse Practitioner

## 2020-03-14 DIAGNOSIS — M47812 Spondylosis without myelopathy or radiculopathy, cervical region: Secondary | ICD-10-CM

## 2020-06-15 ENCOUNTER — Other Ambulatory Visit: Payer: Self-pay | Admitting: Psychiatry

## 2020-06-15 DIAGNOSIS — F331 Major depressive disorder, recurrent, moderate: Secondary | ICD-10-CM

## 2020-06-15 DIAGNOSIS — G4733 Obstructive sleep apnea (adult) (pediatric): Secondary | ICD-10-CM

## 2020-06-19 ENCOUNTER — Ambulatory Visit (INDEPENDENT_AMBULATORY_CARE_PROVIDER_SITE_OTHER): Payer: Medicare Other | Admitting: Psychiatry

## 2020-06-19 ENCOUNTER — Encounter: Payer: Self-pay | Admitting: Psychiatry

## 2020-06-19 ENCOUNTER — Other Ambulatory Visit: Payer: Self-pay

## 2020-06-19 DIAGNOSIS — F331 Major depressive disorder, recurrent, moderate: Secondary | ICD-10-CM

## 2020-06-19 DIAGNOSIS — G4733 Obstructive sleep apnea (adult) (pediatric): Secondary | ICD-10-CM

## 2020-06-19 MED ORDER — ARMODAFINIL 250 MG PO TABS: 250.0000 mg | ORAL_TABLET | Freq: Every day | ORAL | 1 refills | Status: DC

## 2020-06-19 MED ORDER — BUPROPION HCL ER (XL) 150 MG PO TB24: 450.0000 mg | ORAL_TABLET | Freq: Every day | ORAL | 1 refills | Status: DC

## 2020-06-19 MED ORDER — FLUOXETINE HCL 20 MG PO CAPS: 60.0000 mg | ORAL_CAPSULE | Freq: Every day | ORAL | 1 refills | Status: DC

## 2020-06-19 NOTE — Progress Notes (Signed)
JAXSYN AZAM [id number] Oct 06, 1950 70 y.o.  Subjective:   Patient ID:  Theodore Miller is a 70 y.o. (DOB Oct 03, 1950) male.  Chief Complaint:  Chief Complaint  Patient presents with  . Follow-up    Depression, anxiety, and insomnia    HPI Theodore Miller presents to the office today for follow-up of depression, anxiety, and insomnia. "Everything's a little better." He is looking forward to the arrival of new grandchildren since both of his daughters are expecting babies around September. He is looking forward to upcoming vacation. He denies any significant depression. He reports that he avoids crowds and has "pushed away old friends." He reports avoiding certain situations that he does not enjoy. "I don't have anxiety attacks." He avoids going to stores unless he can get in and get out quickly. He reports that he spends most of his time at home. He reports that for years he has avoided leaving home. He reports that he has worry and anxious thoughts. Falls asleep without difficulty. Awakening multiple times a night. Estimates sleeping about 5 hours total. Naps some during the day. Has had a cPap in the past and is being re-evaluated for this. He reports that he also notices RLS. Appetite has been good. He reports energy is ok. He reports motivation is decreased due to chronic pain and "it hurts when I move." He reports that he has difficulty with concentration, such as following instructions or working a puzzle. Able to concentrate to read. Denies SI.   He and his wife continue to watch grandchildren.   He reports that he does not take Xanax prn regularly and estimates taking it about once every 2-3 weeks.   Past Psychiatric Medication Trials: Prozac- Taken since 2009. Denies any change in response. Lexapro- anxiety Cymbalta Wellbutrin Nuvigil Xanax Lamictal Depakote ER Rozerem Gabapentin- Prescribed for neuropathy Abilify- Reportedly more irritability    Review of Systems:  Review  of Systems  Musculoskeletal: Positive for arthralgias and back pain. Negative for gait problem.  Neurological: Negative for tremors.  Psychiatric/Behavioral:       Please refer to HPI     He is going in for a sleep study.   Medications: I have reviewed the patient's current medications.  Current Outpatient Medications  Medication Sig Dispense Refill  . ALPRAZolam (XANAX) 0.5 MG tablet Take 1 tablet (0.5 mg total) by mouth 3 (three) times daily as needed for anxiety. 90 tablet 0  . cholecalciferol (VITAMIN D) 1000 units tablet Take 1,000 Units by mouth daily.    Marland Kitchen docusate sodium (COLACE) 100 MG capsule Take 100 mg by mouth daily.    Marland Kitchen esomeprazole (NEXIUM) 40 MG capsule Take 40 mg by mouth daily.     . ferrous sulfate 325 (65 FE) MG tablet Take 325 mg by mouth daily with breakfast.     . gabapentin (NEURONTIN) 300 MG capsule Take by mouth.    Marland Kitchen lisinopril (PRINIVIL,ZESTRIL) 5 MG tablet Take 5 mg by mouth daily.    . Oxycodone HCl 10 MG TABS Take 10 mg by mouth 4 (four) times daily.     . rosuvastatin (CRESTOR) 20 MG tablet Take 20 mg by mouth every evening.    . topiramate (TOPAMAX) 50 MG tablet Take 50 mg by mouth 2 (two) times daily.    . vitamin C (ASCORBIC ACID) 500 MG tablet Take 500 mg by mouth daily.     Marland Kitchen XTAMPZA ER 13.5 MG C12A Take 1 capsule by mouth 2 (two) times daily.    . [  START ON 09/07/2020] Armodafinil 250 MG tablet Take 1 tablet (250 mg total) by mouth daily. 90 tablet 1  . buPROPion (WELLBUTRIN XL) 150 MG 24 hr tablet Take 3 tablets (450 mg total) by mouth daily. 270 tablet 1  . FLUoxetine (PROZAC) 20 MG capsule Take 3 capsules (60 mg total) by mouth daily. 270 capsule 1   No current facility-administered medications for this visit.   Facility-Administered Medications Ordered in Other Visits  Medication Dose Route Frequency Provider Last Rate Last Admin  . ondansetron (ZOFRAN) 4 mg in sodium chloride 0.9 % 50 mL IVPB  4 mg Intravenous Q6H PRN Ashok Pall, MD         Medication Side Effects: None  Allergies: No Known Allergies  Past Medical History:  Diagnosis Date  . Neuropathy     History reviewed. No pertinent family history.  Social History   Socioeconomic History  . Marital status: Married    Spouse name: Not on file  . Number of children: Not on file  . Years of education: Not on file  . Highest education level: Not on file  Occupational History  . Not on file  Tobacco Use  . Smoking status: Former Research scientist (life sciences)  . Smokeless tobacco: Never Used  Substance and Sexual Activity  . Alcohol use: Not on file  . Drug use: Not on file  . Sexual activity: Not on file  Other Topics Concern  . Not on file  Social History Narrative  . Not on file   Social Determinants of Health   Financial Resource Strain: Not on file  Food Insecurity: Not on file  Transportation Needs: Not on file  Physical Activity: Not on file  Stress: Not on file  Social Connections: Not on file  Intimate Partner Violence: Not on file    Past Medical History, Surgical history, Social history, and Family history were reviewed and updated as appropriate.   Please see review of systems for further details on the patient's review from today.   Objective:   Physical Exam:  BP (!) 164/67   Pulse (!) 44   Physical Exam Constitutional:      General: He is not in acute distress. Musculoskeletal:        General: No deformity.  Neurological:     Mental Status: He is alert and oriented to person, place, and time.     Coordination: Coordination normal.  Psychiatric:        Attention and Perception: Attention and perception normal. He does not perceive auditory or visual hallucinations.        Mood and Affect: Mood normal. Mood is not anxious or depressed. Affect is not labile, blunt, angry or inappropriate.        Speech: Speech normal.        Behavior: Behavior normal.        Thought Content: Thought content normal. Thought content is not paranoid or  delusional. Thought content does not include homicidal or suicidal ideation. Thought content does not include homicidal or suicidal plan.        Cognition and Memory: Cognition and memory normal.        Judgment: Judgment normal.     Comments: Insight intact     Lab Review:     Component Value Date/Time   NA 140 08/15/2007 0435   K 4.1 08/15/2007 0435   CL 101 08/15/2007 0435   CO2 34 (H) 08/15/2007 0435   GLUCOSE 86 08/15/2007 0435   BUN 12 DELTA  CHECK NOTED 08/15/2007 0435   CREATININE 0.95 08/15/2007 0435   CALCIUM 9.3 08/15/2007 0435   PROT 6.8 08/14/2007 0300   ALBUMIN 3.8 08/14/2007 0300   AST 26 08/14/2007 0300   ALT 16 08/14/2007 0300   ALKPHOS 41 08/14/2007 0300   BILITOT 0.3 08/14/2007 0300   GFRNONAA >60 08/15/2007 0435   GFRAA  08/15/2007 0435    >60        The eGFR has been calculated using the MDRD equation. This calculation has not been validated in all clinical       Component Value Date/Time   WBC 3.7 (L) 08/14/2007 0300   RBC 3.88 (L) 08/14/2007 0300   HGB 12.6 (L) 08/14/2007 0300   HCT 36.0 (L) 08/14/2007 0300   PLT 134 (L) 08/14/2007 0300   MCV 92.9 08/14/2007 0300   MCHC 35.0 08/14/2007 0300   RDW 13.6 08/14/2007 0300   LYMPHSABS 1.6 08/14/2007 0300   MONOABS 0.4 08/14/2007 0300   EOSABS 0.1 08/14/2007 0300   BASOSABS 0.0 08/14/2007 0300    No results found for: POCLITH, LITHIUM   Lab Results  Component Value Date   VALPROATE 65.3 08/15/2007     .res Assessment: Plan:   Re-evaluate decrease in Wellbutrin XL at next visit if mood remains stable. Continues Wellbutrin XL 450 mg q am for depression. He reports that he does not need Xanax at this time. Continue Prozac 60 mg po qd for depression and anxiety. Continue Armodafinil 250 mg q am for OSA. Pt to follow-up in 6 months or sooner if clinically indicated.  Patient advised to contact office with any questions, adverse effects, or acute worsening in signs and symptoms.   Gianny was  seen today for follow-up.  Diagnoses and all orders for this visit:  Major depressive disorder, recurrent episode, moderate (HCC) -     Armodafinil 250 MG tablet; Take 1 tablet (250 mg total) by mouth daily. -     buPROPion (WELLBUTRIN XL) 150 MG 24 hr tablet; Take 3 tablets (450 mg total) by mouth daily. -     FLUoxetine (PROZAC) 20 MG capsule; Take 3 capsules (60 mg total) by mouth daily.  Obstructive sleep apnea syndrome -     Armodafinil 250 MG tablet; Take 1 tablet (250 mg total) by mouth daily.     Please see After Visit Summary for patient specific instructions.  Future Appointments  Date Time Provider [place]  12/17/2020 10:30 AM Thayer Headings, PMHNP [facility] None    No orders of the defined types were placed in this encounter.   -------------------------------

## 2020-06-23 ENCOUNTER — Telehealth: Payer: Self-pay

## 2020-06-23 NOTE — Telephone Encounter (Signed)
Prior Authorization submitted and approved for ARMODAFINIL 250 MG #90/90 DAY effective 06/23/2020-12/21/2020 with [facility] ID# [id number], PA# [id number]

## 2020-09-13 ENCOUNTER — Other Ambulatory Visit: Payer: Self-pay | Admitting: Psychiatry

## 2020-09-13 DIAGNOSIS — G4733 Obstructive sleep apnea (adult) (pediatric): Secondary | ICD-10-CM

## 2020-09-13 DIAGNOSIS — F331 Major depressive disorder, recurrent, moderate: Secondary | ICD-10-CM

## 2020-09-16 ENCOUNTER — Telehealth: Payer: Self-pay | Admitting: Psychiatry

## 2020-09-16 DIAGNOSIS — G4733 Obstructive sleep apnea (adult) (pediatric): Secondary | ICD-10-CM

## 2020-09-16 DIAGNOSIS — F331 Major depressive disorder, recurrent, moderate: Secondary | ICD-10-CM

## 2020-09-16 NOTE — Telephone Encounter (Signed)
Okay to pend for [facility] ?

## 2020-09-16 NOTE — Telephone Encounter (Signed)
Pt's wife Cecille Rubin called to report [facility] does not have Armodafinil  250 mg 1/d #90. Pt asking to send to [facility] mail order [phone]. Contact Lori with any questions [phone]

## 2020-09-17 MED ORDER — ARMODAFINIL 250 MG PO TABS: 250.0000 mg | ORAL_TABLET | Freq: Every day | ORAL | 0 refills | Status: DC

## 2020-09-17 NOTE — Telephone Encounter (Signed)
Script sent to [facility]

## 2020-12-03 ENCOUNTER — Other Ambulatory Visit: Payer: Self-pay | Admitting: Psychiatry

## 2020-12-03 DIAGNOSIS — G4733 Obstructive sleep apnea (adult) (pediatric): Secondary | ICD-10-CM

## 2020-12-03 DIAGNOSIS — F331 Major depressive disorder, recurrent, moderate: Secondary | ICD-10-CM

## 2020-12-04 NOTE — Telephone Encounter (Signed)
Last filled 4/14 appt on 7/14

## 2020-12-17 ENCOUNTER — Ambulatory Visit (INDEPENDENT_AMBULATORY_CARE_PROVIDER_SITE_OTHER): Payer: Medicare Other | Admitting: Psychiatry

## 2020-12-17 ENCOUNTER — Encounter: Payer: Self-pay | Admitting: Psychiatry

## 2020-12-17 ENCOUNTER — Other Ambulatory Visit: Payer: Self-pay

## 2020-12-17 DIAGNOSIS — F331 Major depressive disorder, recurrent, moderate: Secondary | ICD-10-CM | POA: Diagnosis not present

## 2020-12-17 DIAGNOSIS — G4733 Obstructive sleep apnea (adult) (pediatric): Secondary | ICD-10-CM | POA: Diagnosis not present

## 2020-12-17 MED ORDER — ARMODAFINIL 250 MG PO TABS: 250.0000 mg | ORAL_TABLET | Freq: Every day | ORAL | 1 refills | Status: DC

## 2020-12-17 MED ORDER — BUPROPION HCL ER (XL) 150 MG PO TB24: ORAL_TABLET | ORAL | 1 refills | Status: DC

## 2020-12-17 MED ORDER — FLUOXETINE HCL 20 MG PO CAPS: 60.0000 mg | ORAL_CAPSULE | Freq: Every day | ORAL | 1 refills | Status: DC

## 2020-12-17 NOTE — Progress Notes (Signed)
Theodore Miller [id number] 04/23/1951 70 y.o.  Subjective:   Patient ID:  Theodore Miller is a 70 y.o. (DOB [birth year]) male.  Chief Complaint:  Chief Complaint  Patient presents with   Follow-up    Depression, anxiety, OSA, insomnia     HPI Theodore Miller presents to the office today for follow-up of depression, anxiety, OSA, and insomnia. Mood has been "ok." "Ive been pretty good, actually." He reports that he is taking Xanax prn about once a week and reports that he seldom has anxiety. He reports that he has been trying to adjust to new cPap machine. He reports that he has also been having RLS. Energy and motivation have been "ok." He reports that pain has interfered with being more active. He reports concentration is adequate for reading. He reports that he has difficulty concentrating when writing, working a puzzle, or reading instructions. Denies SI.  He reports that Armodafinil is helpful for staying awake.   Enjoying grandchildren and is expecting another grandchild soon.   Xanax not filled in the last year.   Past Psychiatric Medication Trials: Prozac- Taken since 2009. Denies any change in response. Lexapro- anxiety Cymbalta Wellbutrin Nuvigil Xanax Lamictal Depakote ER Rozerem Gabapentin- Prescribed for neuropathy Abilify- Reportedly more irritability    Review of Systems:  Review of Systems  Musculoskeletal:  Positive for arthralgias and back pain.  Neurological:  Positive for tremors.  Psychiatric/Behavioral:         Please refer to HPI   Medications: I have reviewed the patient's current medications.  Current Outpatient Medications  Medication Sig Dispense Refill   ALPRAZolam (XANAX) 0.5 MG tablet Take 1 tablet (0.5 mg total) by mouth 3 (three) times daily as needed for anxiety. 90 tablet 0   cholecalciferol (VITAMIN D) 1000 units tablet Take 1,000 Units by mouth daily.     docusate sodium (COLACE) 100 MG capsule Take 100 mg by mouth daily.      esomeprazole (NEXIUM) 40 MG capsule Take 40 mg by mouth daily.      ferrous sulfate 325 (65 FE) MG tablet Take 325 mg by mouth daily with breakfast.      gabapentin (NEURONTIN) 300 MG capsule Take by mouth.     lisinopril (PRINIVIL,ZESTRIL) 5 MG tablet Take 5 mg by mouth daily.     Oxycodone HCl 10 MG TABS Take 10 mg by mouth 4 (four) times daily.      rosuvastatin (CRESTOR) 20 MG tablet Take 20 mg by mouth every evening.     topiramate (TOPAMAX) 50 MG tablet Take 50 mg by mouth 2 (two) times daily.     vitamin C (ASCORBIC ACID) 500 MG tablet Take 500 mg by mouth daily.      XTAMPZA ER 13.5 MG C12A Take 1 capsule by mouth 2 (two) times daily.     [START ON 02/26/2021] Armodafinil 250 MG tablet Take 1 tablet (250 mg total) by mouth daily. 90 tablet 1   buPROPion (WELLBUTRIN XL) 150 MG 24 hr tablet Take 2-3 tablets daily 270 tablet 1   FLUoxetine (PROZAC) 20 MG capsule Take 3 capsules (60 mg total) by mouth daily. 270 capsule 1   No current facility-administered medications for this visit.   Facility-Administered Medications Ordered in Other Visits  Medication Dose Route Frequency Provider Last Rate Last Admin   ondansetron (ZOFRAN) 4 mg in sodium chloride 0.9 % 50 mL IVPB  4 mg Intravenous Q6H PRN Ashok Pall, MD  Medication Side Effects: None  Allergies: No Known Allergies  Past Medical History:  Diagnosis Date   Neuropathy     Past Medical History, Surgical history, Social history, and Family history were reviewed and updated as appropriate.   Please see review of systems for further details on the patient's review from today.   Objective:   Physical Exam:  BP (!) 151/76   Pulse (!) 42   Physical Exam Constitutional:      General: He is not in acute distress. Musculoskeletal:        General: No deformity.  Neurological:     Mental Status: He is alert and oriented to person, place, and time.     Coordination: Coordination normal.  Psychiatric:        Attention  and Perception: Attention and perception normal. He does not perceive auditory or visual hallucinations.        Mood and Affect: Mood normal. Mood is not anxious or depressed. Affect is not labile, blunt, angry or inappropriate.        Speech: Speech normal.        Behavior: Behavior normal.        Thought Content: Thought content normal. Thought content is not paranoid or delusional. Thought content does not include homicidal or suicidal ideation. Thought content does not include homicidal or suicidal plan.        Cognition and Memory: Cognition and memory normal.        Judgment: Judgment normal.     Comments: Insight intact    Lab Review:     Component Value Date/Time   NA 140 08/15/2007 0435   K 4.1 08/15/2007 0435   CL 101 08/15/2007 0435   CO2 34 (H) 08/15/2007 0435   GLUCOSE 86 08/15/2007 0435   BUN 12 DELTA CHECK NOTED 08/15/2007 0435   CREATININE 0.95 08/15/2007 0435   CALCIUM 9.3 08/15/2007 0435   PROT 6.8 08/14/2007 0300   ALBUMIN 3.8 08/14/2007 0300   AST 26 08/14/2007 0300   ALT 16 08/14/2007 0300   ALKPHOS 41 08/14/2007 0300   BILITOT 0.3 08/14/2007 0300   GFRNONAA >60 08/15/2007 0435   GFRAA  08/15/2007 0435    >60        The eGFR has been calculated using the MDRD equation. This calculation has not been validated in all clinical       Component Value Date/Time   WBC 3.7 (L) 08/14/2007 0300   RBC 3.88 (L) 08/14/2007 0300   HGB 12.6 (L) 08/14/2007 0300   HCT 36.0 (L) 08/14/2007 0300   PLT 134 (L) 08/14/2007 0300   MCV 92.9 08/14/2007 0300   MCHC 35.0 08/14/2007 0300   RDW 13.6 08/14/2007 0300   LYMPHSABS 1.6 08/14/2007 0300   MONOABS 0.4 08/14/2007 0300   EOSABS 0.1 08/14/2007 0300   BASOSABS 0.0 08/14/2007 0300    No results found for: POCLITH, LITHIUM   Lab Results  Component Value Date   VALPROATE 65.3 08/15/2007     .res Assessment: Plan:   Pt reports that he would like to reduce medication if possible.  Discussed that Wellbutrin XL  could be decreased from 450 mg to 300 mg. Will write script for Wellbutrin XL 150 mg 2-3 tablets so he can resume 450 mg dose in case he has worsening depression with decrease to 300 mg. Continue Prozac 60 mg po qd for depression.  Continue Xanax 0.5 mg po TID prn anxiety.  Continue Armodafinil 250 mg po qd for  sleep apnea.  Pt to follow-up in 6 months or sooner if clinically indicated.  Patient advised to contact office with any questions, adverse effects, or acute worsening in signs and symptoms. Patient advised to contact office with any questions, adverse effects, or acute worsening in signs and symptoms.   Theodore Miller was seen today for follow-up.  Diagnoses and all orders for this visit:  Major depressive disorder, recurrent episode, moderate (HCC) -     buPROPion (WELLBUTRIN XL) 150 MG 24 hr tablet; Take 2-3 tablets daily -     Armodafinil 250 MG tablet; Take 1 tablet (250 mg total) by mouth daily. -     FLUoxetine (PROZAC) 20 MG capsule; Take 3 capsules (60 mg total) by mouth daily.  Obstructive sleep apnea syndrome -     Armodafinil 250 MG tablet; Take 1 tablet (250 mg total) by mouth daily.    Please see After Visit Summary for patient specific instructions.    No orders of the defined types were placed in this encounter.   -------------------------------

## 2020-12-24 ENCOUNTER — Other Ambulatory Visit: Payer: Self-pay | Admitting: Psychiatry

## 2020-12-24 DIAGNOSIS — F331 Major depressive disorder, recurrent, moderate: Secondary | ICD-10-CM

## 2021-03-09 ENCOUNTER — Telehealth: Payer: Self-pay

## 2021-03-09 NOTE — Telephone Encounter (Signed)
Prior Authorization submitted and approved for ARMODAFINIL 250 MG #90/90 effective 03/07/2021-09/05/2021 with [facility] Medicare Part D, ID# [id number].  PA# [id number]

## 2021-06-18 ENCOUNTER — Encounter: Payer: Self-pay | Admitting: Psychiatry

## 2021-06-18 ENCOUNTER — Ambulatory Visit (INDEPENDENT_AMBULATORY_CARE_PROVIDER_SITE_OTHER): Payer: Medicare Other | Admitting: Psychiatry

## 2021-06-18 ENCOUNTER — Other Ambulatory Visit: Payer: Self-pay

## 2021-06-18 DIAGNOSIS — G4733 Obstructive sleep apnea (adult) (pediatric): Secondary | ICD-10-CM

## 2021-06-18 DIAGNOSIS — F331 Major depressive disorder, recurrent, moderate: Secondary | ICD-10-CM

## 2021-06-18 MED ORDER — FLUOXETINE HCL 20 MG PO CAPS: 60.0000 mg | ORAL_CAPSULE | Freq: Every day | ORAL | 1 refills | Status: DC

## 2021-06-18 MED ORDER — BUPROPION HCL ER (XL) 150 MG PO TB24: ORAL_TABLET | ORAL | 1 refills | Status: DC

## 2021-06-18 MED ORDER — ARMODAFINIL 250 MG PO TABS: 250.0000 mg | ORAL_TABLET | Freq: Every day | ORAL | 1 refills | Status: DC

## 2021-06-18 NOTE — Progress Notes (Signed)
Theodore Miller [id number] May 17, 1951 71 y.o.  Subjective:   Patient ID:  Theodore Miller is a 71 y.o. (DOB Jun 07, 1950) male.  Chief Complaint:  Chief Complaint  Patient presents with   Follow-up    Depression, sleep disturbance    HPI Theodore Miller presents to the office today for follow-up of depression and sleep disturbance. He denies complaints. He reports that mood has been "ok, no problem." Denies anxiety. Energy and motivation have been fair. He reports poor concentration. He reports that he has some difficulty with ST memory and may have difficulty recalling multiple steps. Enjoys reading. Likes doing things outside. No change in appetite. Denies SI.   He reports that he is unable to tolerate cPap machine. "I need it, but I can't use it." His wife tells him he continues to snore and have periods of apnea. He is unaware of awakenings. Denies dozing off during the day unless he intentionally takes a nap.   Sees grandchildren regularly. Fourth grandchild was born and family is doing well.   Has not used Xanax recently.   Past Psychiatric Medication Trials: Prozac- Taken since 2009. Denies any change in response. Lexapro- anxiety Cymbalta Wellbutrin Nuvigil Xanax Lamictal Depakote ER Rozerem Gabapentin- Prescribed for neuropathy Abilify- Reportedly more irritability    Review of Systems:  Review of Systems  Cardiovascular:  Negative for palpitations.  Musculoskeletal:  Positive for back pain and neck pain. Negative for gait problem.       Leg pain  Psychiatric/Behavioral:         Please refer to HPI   Medications: I have reviewed the patient's current medications.  Current Outpatient Medications  Medication Sig Dispense Refill   cholecalciferol (VITAMIN D) 1000 units tablet Take 1,000 Units by mouth daily.     docusate sodium (COLACE) 100 MG capsule Take 100 mg by mouth daily.     esomeprazole (NEXIUM) 40 MG capsule Take 40 mg by mouth daily.      gabapentin  (NEURONTIN) 300 MG capsule Take by mouth.     ALPRAZolam (XANAX) 0.5 MG tablet Take 1 tablet (0.5 mg total) by mouth 3 (three) times daily as needed for anxiety. 90 tablet 0   [START ON 09/07/2021] Armodafinil 250 MG tablet Take 1 tablet (250 mg total) by mouth daily. 90 tablet 1   buPROPion (WELLBUTRIN XL) 150 MG 24 hr tablet Take 2-3 tablets daily 270 tablet 1   FLUoxetine (PROZAC) 20 MG capsule Take 3 capsules (60 mg total) by mouth daily. 270 capsule 1   lisinopril (PRINIVIL,ZESTRIL) 5 MG tablet Take 5 mg by mouth daily.     Oxycodone HCl 10 MG TABS Take 10 mg by mouth 5 (five) times daily.     rosuvastatin (CRESTOR) 20 MG tablet Take 20 mg by mouth every evening.     topiramate (TOPAMAX) 50 MG tablet Take 50 mg by mouth 2 (two) times daily.     vitamin C (ASCORBIC ACID) 500 MG tablet Take 500 mg by mouth daily.      XTAMPZA ER 13.5 MG C12A Take 1 capsule by mouth 2 (two) times daily.     No current facility-administered medications for this visit.   Facility-Administered Medications Ordered in Other Visits  Medication Dose Route Frequency Provider Last Rate Last Admin   ondansetron (ZOFRAN) 4 mg in sodium chloride 0.9 % 50 mL IVPB  4 mg Intravenous Q6H PRN Ashok Pall, MD        Medication Side Effects: None  Allergies:  No Known Allergies  Past Medical History:  Diagnosis Date   Neuropathy     Past Medical History, Surgical history, Social history, and Family history were reviewed and updated as appropriate.   Please see review of systems for further details on the patient's review from today.   Objective:   Physical Exam:  BP 118/62    Pulse (!) 46   Physical Exam Constitutional:      General: He is not in acute distress. Musculoskeletal:        General: No deformity.  Neurological:     Mental Status: He is alert and oriented to person, place, and time.     Coordination: Coordination normal.  Psychiatric:        Attention and Perception: Attention and perception  normal. He does not perceive auditory or visual hallucinations.        Mood and Affect: Mood normal. Mood is not anxious or depressed. Affect is not labile, blunt, angry or inappropriate.        Speech: Speech normal.        Behavior: Behavior normal.        Thought Content: Thought content normal. Thought content is not paranoid or delusional. Thought content does not include homicidal or suicidal ideation. Thought content does not include homicidal or suicidal plan.        Cognition and Memory: Cognition and memory normal.        Judgment: Judgment normal.     Comments: Insight intact    Lab Review:     Component Value Date/Time   NA 140 08/15/2007 0435   K 4.1 08/15/2007 0435   CL 101 08/15/2007 0435   CO2 34 (H) 08/15/2007 0435   GLUCOSE 86 08/15/2007 0435   BUN 12 DELTA CHECK NOTED 08/15/2007 0435   CREATININE 0.95 08/15/2007 0435   CALCIUM 9.3 08/15/2007 0435   PROT 6.8 08/14/2007 0300   ALBUMIN 3.8 08/14/2007 0300   AST 26 08/14/2007 0300   ALT 16 08/14/2007 0300   ALKPHOS 41 08/14/2007 0300   BILITOT 0.3 08/14/2007 0300   GFRNONAA >60 08/15/2007 0435   GFRAA  08/15/2007 0435    >60        The eGFR has been calculated using the MDRD equation. This calculation has not been validated in all clinical       Component Value Date/Time   WBC 3.7 (L) 08/14/2007 0300   RBC 3.88 (L) 08/14/2007 0300   HGB 12.6 (L) 08/14/2007 0300   HCT 36.0 (L) 08/14/2007 0300   PLT 134 (L) 08/14/2007 0300   MCV 92.9 08/14/2007 0300   MCHC 35.0 08/14/2007 0300   RDW 13.6 08/14/2007 0300   LYMPHSABS 1.6 08/14/2007 0300   MONOABS 0.4 08/14/2007 0300   EOSABS 0.1 08/14/2007 0300   BASOSABS 0.0 08/14/2007 0300    No results found for: POCLITH, LITHIUM   Lab Results  Component Value Date   VALPROATE 65.3 08/15/2007     .res Assessment: Plan:   Will continue current plan of care since target signs and symptoms are well controlled without any tolerability issues. Continue  armodafinil 250 mg daily for sleep apnea.  He requests that prescription for armodafinil to be sent to [facility]. Continue Wellbutrin XL 150 mg 2-3 tablets daily.  Discussed that he could attempt dose reduction to 2 tablets daily since he has reported desire to reduce medication if possible.  Discussed that he can continue 3 tablets daily if he experiences worsening mood signs and  symptoms with lower dose. Continue Prozac 60 mg daily for anxiety and depression. Patient to follow-up in 6 months or sooner if clinically indicated. Patient advised to contact office with any questions, adverse effects, or acute worsening in signs and symptoms.   Theodore Miller was seen today for follow-up.  Diagnoses and all orders for this visit:  Major depressive disorder, recurrent episode, moderate (HCC) -     Armodafinil 250 MG tablet; Take 1 tablet (250 mg total) by mouth daily. -     buPROPion (WELLBUTRIN XL) 150 MG 24 hr tablet; Take 2-3 tablets daily -     FLUoxetine (PROZAC) 20 MG capsule; Take 3 capsules (60 mg total) by mouth daily.  Obstructive sleep apnea syndrome -     Armodafinil 250 MG tablet; Take 1 tablet (250 mg total) by mouth daily.     Please see After Visit Summary for patient specific instructions.  Future Appointments  Date Time Provider [place]  12/16/2021 10:30 AM Thayer Headings, PMHNP CP-CP None    No orders of the defined types were placed in this encounter.   -------------------------------

## 2021-08-01 ENCOUNTER — Encounter (HOSPITAL_BASED_OUTPATIENT_CLINIC_OR_DEPARTMENT_OTHER): Payer: Self-pay

## 2021-08-01 ENCOUNTER — Other Ambulatory Visit: Payer: Self-pay

## 2021-08-01 ENCOUNTER — Emergency Department (HOSPITAL_BASED_OUTPATIENT_CLINIC_OR_DEPARTMENT_OTHER)
Admission: EM | Admit: 2021-08-01 | Discharge: 2021-08-01 | Disposition: A | Discharge status: Home / Self Care | Payer: Medicare Other | Attending: Emergency Medicine | Admitting: Emergency Medicine

## 2021-08-01 ENCOUNTER — Emergency Department (HOSPITAL_BASED_OUTPATIENT_CLINIC_OR_DEPARTMENT_OTHER): Payer: Medicare Other

## 2021-08-01 DIAGNOSIS — Z20822 Contact with and (suspected) exposure to covid-19: Secondary | ICD-10-CM | POA: Insufficient documentation

## 2021-08-01 DIAGNOSIS — R0602 Shortness of breath: Secondary | ICD-10-CM | POA: Diagnosis present

## 2021-08-01 DIAGNOSIS — R0789 Other chest pain: Secondary | ICD-10-CM | POA: Diagnosis not present

## 2021-08-01 DIAGNOSIS — R071 Chest pain on breathing: Secondary | ICD-10-CM

## 2021-08-01 DIAGNOSIS — N3289 Other specified disorders of bladder: Secondary | ICD-10-CM | POA: Diagnosis not present

## 2021-08-01 HISTORY — DX: Cervicalgia: M54.2

## 2021-08-01 HISTORY — DX: Dorsalgia, unspecified: M54.9

## 2021-08-01 HISTORY — DX: Essential (primary) hypertension: I10

## 2021-08-01 HISTORY — DX: Hyperlipidemia, unspecified: E78.5

## 2021-08-01 LAB — COMPREHENSIVE METABOLIC PANEL
ALT: 38 U/L (ref 0–44)
AST: 29 U/L (ref 15–41)
Albumin: 4.3 g/dL (ref 3.5–5.0)
Alkaline Phosphatase: 68 U/L (ref 38–126)
Anion gap: 8 (ref 5–15)
BUN: 32 mg/dL — ABNORMAL HIGH (ref 8–23)
CO2: 23 mmol/L (ref 22–32)
Calcium: 9.1 mg/dL (ref 8.9–10.3)
Chloride: 106 mmol/L (ref 98–111)
Creatinine, Ser: 1.57 mg/dL — ABNORMAL HIGH (ref 0.61–1.24)
GFR, Estimated: 47 mL/min — ABNORMAL LOW (ref >60)
Glucose, Bld: 163 mg/dL — ABNORMAL HIGH (ref 70–99)
Potassium: 4.5 mmol/L (ref 3.5–5.1)
Sodium: 137 mmol/L (ref 135–145)
Total Bilirubin: 0.4 mg/dL (ref 0.3–1.2)
Total Protein: 7.4 g/dL (ref 6.5–8.1)

## 2021-08-01 LAB — CBC WITH DIFFERENTIAL/PLATELET
Abs Immature Granulocytes: 0.02 10*3/uL (ref 0.00–0.07)
Basophils Absolute: 0 10*3/uL (ref 0.0–0.1)
Basophils Relative: 1 %
Eosinophils Absolute: 0.1 10*3/uL (ref 0.0–0.5)
Eosinophils Relative: 2 %
HCT: 37.2 % — ABNORMAL LOW (ref 39.0–52.0)
Hemoglobin: 12.7 g/dL — ABNORMAL LOW (ref 13.0–17.0)
Immature Granulocytes: 0 %
Lymphocytes Relative: 21 %
Lymphs Abs: 1.4 10*3/uL (ref 0.7–4.0)
MCH: 34.1 pg — ABNORMAL HIGH (ref 26.0–34.0)
MCHC: 34.1 g/dL (ref 30.0–36.0)
MCV: 100 fL (ref 80.0–100.0)
Monocytes Absolute: 0.5 10*3/uL (ref 0.1–1.0)
Monocytes Relative: 7 %
Neutro Abs: 4.6 10*3/uL (ref 1.7–7.7)
Neutrophils Relative %: 69 %
Platelets: 141 10*3/uL — ABNORMAL LOW (ref 150–400)
RBC: 3.72 MIL/uL — ABNORMAL LOW (ref 4.22–5.81)
RDW: 12.6 % (ref 11.5–15.5)
WBC: 6.6 10*3/uL (ref 4.0–10.5)
nRBC: 0 % (ref 0.0–0.2)

## 2021-08-01 LAB — TROPONIN I (HIGH SENSITIVITY): Troponin I (High Sensitivity): 5 ng/L (ref <18)

## 2021-08-01 LAB — RESP PANEL BY RT-PCR (FLU A&B, COVID) ARPGX2
Influenza A by PCR: NEGATIVE
Influenza B by PCR: NEGATIVE
SARS Coronavirus 2 by RT PCR: NEGATIVE

## 2021-08-01 MED ORDER — HYDROMORPHONE HCL 1 MG/ML IJ SOLN
1.0000 mg | Freq: Once | INTRAMUSCULAR | Status: AC
  Administered 2021-08-01: 1 mg via INTRAVENOUS
  Filled 2021-08-01: qty 1

## 2021-08-01 MED ORDER — IOHEXOL 300 MG/ML  SOLN
100.0000 mL | Freq: Once | INTRAMUSCULAR | Status: AC | PRN
  Administered 2021-08-01: 100 mL via INTRAVENOUS

## 2021-08-01 NOTE — Discharge Instructions (Addendum)
All the lab work today is normal and the CAT scan did not show any signs of broken ribs, pneumonia or any new findings in your abdomen.  For some time now you have had significant stretching of your bladder indicating that you are not getting all your urine out it would be a good idea to follow-up with a specialist about this before becomes a problem.  The pain you are experiencing is most likely related to your fall and its just badly bruised.  You can try using over-the-counter Voltaren gel and lidocaine patches to see if that gives you additional pain relief.  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

## 2021-08-01 NOTE — ED Provider Notes (Signed)
Emergency Department Provider Note   I have reviewed the triage vital signs and the nursing notes.   HISTORY  Chief Complaint Fall, Rib Injury, Shortness of Breath, and Shoulder Pain   HPI Theodore Miller is a 71 y.o. male with PMH reviewed below presents to the ED with left chest pain progressively worsening over the last 8-9 days. Patient was sleeping on the cough when he rolled off and struck his left chest on a cooler positioned below him. He had only moderate pain at that time. No SOB. Over the last 2 days, however, he has developed worsening pain and SOB symptoms. No productive cough, fever, chills, or hemoptysis. He takes oxycontin chronically.     Past Medical History:  Diagnosis Date   Back pain    Hyperlipidemia    Hypertension    Neck pain    Neuropathy     Review of Systems  Constitutional: No fever/chills Eyes: No visual changes. ENT: No sore throat. Cardiovascular: Positive chest pain. Respiratory: Positive shortness of breath. Gastrointestinal: No abdominal pain.   Genitourinary: Negative for dysuria. Musculoskeletal: Negative for back pain. Skin: Negative for rash. Neurological: Negative for headaches, focal weakness or numbness.   ____________________________________________   PHYSICAL EXAM:  VITAL SIGNS: ED Triage Vitals  Enc Vitals Group     BP 08/01/21 0550 (!) 131/53     Pulse Rate 08/01/21 0550 61     Resp 08/01/21 0550 (!) 30     Temp 08/01/21 0550 97.9 F (36.6 C)     Temp Source 08/01/21 0550 Oral     SpO2 08/01/21 0550 97 %     Weight 08/01/21 0555 184 lb (83.5 kg)     Height 08/01/21 0555 5\' 8"  (1.727 m)   Constitutional: Alert and oriented. Some splinting with breathing and holding the left chest.  Eyes: Conjunctivae are normal.  Head: Atraumatic. Nose: No congestion/rhinnorhea. Mouth/Throat: Mucous membranes are moist.   Neck: No stridor.  Cardiovascular: Normal rate, regular rhythm. Good peripheral circulation. Grossly  normal heart sounds.   Respiratory: Normal respiratory effort.  No retractions. Lungs CTAB. Gastrointestinal: Soft and nontender. No distention.  Musculoskeletal: No lower extremity tenderness nor edema. No gross deformities of extremities. Tenderness to palpation of the left lateral and anterior chest wall.  Neurologic:  Normal speech and language. Baseline tremor.  Skin:  Skin is warm, dry and intact. No rash noted.  ____________________________________________   LABS (all labs ordered are listed, but only abnormal results are displayed)  Labs Reviewed  COMPREHENSIVE METABOLIC PANEL - Abnormal; Notable for the following components:      Result Value   Glucose, Bld 163 (*)    BUN 32 (*)    Creatinine, Ser 1.57 (*)    GFR, Estimated 47 (*)    All other components within normal limits  CBC WITH DIFFERENTIAL/PLATELET - Abnormal; Notable for the following components:   RBC 3.72 (*)    Hemoglobin 12.7 (*)    HCT 37.2 (*)    MCH 34.1 (*)    Platelets 141 (*)    All other components within normal limits  RESP PANEL BY RT-PCR (FLU A&B, COVID) ARPGX2  TROPONIN I (HIGH SENSITIVITY)   ____________________________________________  EKG   EKG Interpretation  Date/Time:  Sunday August 01 2021 05:53:55 EST Ventricular Rate:  60 PR Interval:  159 QRS Duration: 90 QT Interval:  398 QTC Calculation: 398 R Axis:   8 Text Interpretation: Sinus rhythm Abnormal R-wave progression, early transition Confirmed by Michel Eskelson,  Vonna Kotyk [phone]) on 08/01/2021 5:55:14 AM       ____________________________________________   PROCEDURES  Procedure(s) performed:   Procedures  None ____________________________________________   INITIAL IMPRESSION / ASSESSMENT AND PLAN / ED COURSE  Pertinent labs & imaging results that were available during my care of the patient were reviewed by me and considered in my medical decision making (see chart for details).   This patient is Presenting for Evaluation  of CP, which does require a range of treatment options, and is a complaint that involves a high risk of morbidity and mortality.  The Differential Diagnoses includes all life-threatening causes for chest pain. This includes but is not exclusive to acute coronary syndrome, aortic dissection, pulmonary embolism, cardiac tamponade, community-acquired pneumonia, pericarditis, musculoskeletal chest wall pain, etc.   Critical Interventions- IV pain medication   Medications  iohexol (OMNIPAQUE) 300 MG/ML solution 100 mL (100 mLs Intravenous Contrast Given 08/01/21 0714)  HYDROmorphone (DILAUDID) injection 1 mg (1 mg Intravenous Given 08/01/21 0818)    Reassessment after intervention:  pain improved.    I did obtain Additional Historical Information from family at bedside.   I decided to review pertinent External Data, and in summary no recent ED visits or PCP visits in our system.    Clinical Laboratory Tests Ordered, included   Radiologic Tests Ordered, included CXR. I independently interpreted the images and agree with radiology interpretation.   Cardiac Monitor Tracing which shows NSR.   Social Determinants of Health Risk former smoker.    Medical Decision Making: Summary:  Patient presents to the emergency department with left chest pain, worse with palpation.  Lower suspicion for ACS or PE but patient describing some shortness of breath and pain now radiating into the left shoulder.  He has some mild tachypnea but normal oxygen saturation.  Equal breath sounds bilaterally.  Will need to evaluate for potential rib fracture, infiltrate, pneumothorax. Will send troponin and basic labs. EKG interpreted by me as above.   CT pending. Care transferred to Dr. Maryan Rued.  Reevaluation with update and discussion with patient and family at bedside.   Disposition: pending  ____________________________________________  FINAL CLINICAL IMPRESSION(S) / ED DIAGNOSES  Final diagnoses:  Pain of  anterior chest wall with respiration  Bladder distention     Note:  This document was prepared using Dragon voice recognition software and may include unintentional dictation errors.  Nanda Quinton, MD, [facility]    Sie Formisano, Wonda [facility], MD 08/04/21 1254

## 2021-08-01 NOTE — ED Notes (Signed)
Patient transported to X-ray 

## 2021-08-01 NOTE — ED Triage Notes (Signed)
Patient states about 8-9 days ago he sustained a fall during which he rolled off of a couch onto a cooler.  2 days ago he began experiencing L rib pain which has gotten progressively worse.  Pain radiates to L shoulder.  Ambulatory into the ED without gait disturbance.  Respirations shallow.  Patient has pmh of tremor.

## 2021-08-01 NOTE — ED Provider Notes (Signed)
I assumed care from Dr. Laverta Baltimore at 7 AM.  Patient presenting with ongoing pain in the left side of his chest that is been progressive over the last week or more after he rolled off the couch and hit her nose left chest on a cooler.  He reports in the last 2 days the pain is really become severe.  He has not had any infectious symptoms.  Labs prior to my arrival appeared at baseline.  Patient does take oxycodone 10 mg daily for pain but reports this was not helping.  Patient was pending a CT of his chest abdomen pelvis for further evaluation.  I independently viewed and interpreted his CT.  No evidence of pneumonia, pneumothorax, patient has significant bladder distention and significant bowel gas.  Radiology reports that patient has no traumatic findings at this time.  Diffuse distention of the colon with gas and stool but no evidence of bowel obstruction.  He has a chronically distended bladder.  Went to reevaluate the patient he still having significant pain and is reporting he would appreciate medication for pain which he was given.  Discussed with him the findings of the CT and no specific cause for the pain today however suspected to be musculoskeletal it is a much worse when he breathes in with palpation.  No indication of cardiac cause today and low suspicion for PE as patient is not tachycardic takes no beta-blockers and does not have significant risk factors for PE.  There is no evidence of shingles today.  Did discuss patient with the patient the finding of the significant distention of his bladder and he has never seen anyone for this in discussed with him and may be a good idea to follow-up with urology.  Otherwise there is no admission criteria today or other findings for his pain.  Patient is stable for discharge   Blanchie Dessert, MD 08/01/21 [phone]

## 2021-08-01 NOTE — ED Notes (Signed)
Returned from  X-ray

## 2021-10-14 ENCOUNTER — Other Ambulatory Visit: Payer: Self-pay | Admitting: Psychiatry

## 2021-10-14 DIAGNOSIS — F331 Major depressive disorder, recurrent, moderate: Secondary | ICD-10-CM

## 2021-12-16 ENCOUNTER — Ambulatory Visit: Payer: Medicare Other | Admitting: Psychiatry

## 2021-12-22 ENCOUNTER — Ambulatory Visit: Payer: Medicare Other | Admitting: Psychiatry

## 2021-12-24 ENCOUNTER — Emergency Department (HOSPITAL_BASED_OUTPATIENT_CLINIC_OR_DEPARTMENT_OTHER)
Admission: EM | Admit: 2021-12-24 | Discharge: 2021-12-24 | Disposition: A | Discharge status: Home / Self Care | Payer: Medicare Other | Attending: Emergency Medicine | Admitting: Emergency Medicine

## 2021-12-24 ENCOUNTER — Emergency Department (HOSPITAL_BASED_OUTPATIENT_CLINIC_OR_DEPARTMENT_OTHER): Payer: Medicare Other

## 2021-12-24 ENCOUNTER — Encounter (HOSPITAL_BASED_OUTPATIENT_CLINIC_OR_DEPARTMENT_OTHER): Payer: Self-pay

## 2021-12-24 ENCOUNTER — Other Ambulatory Visit: Payer: Self-pay

## 2021-12-24 DIAGNOSIS — Y92814 Boat as the place of occurrence of the external cause: Secondary | ICD-10-CM | POA: Insufficient documentation

## 2021-12-24 DIAGNOSIS — I10 Essential (primary) hypertension: Secondary | ICD-10-CM | POA: Diagnosis not present

## 2021-12-24 DIAGNOSIS — R600 Localized edema: Secondary | ICD-10-CM | POA: Insufficient documentation

## 2021-12-24 DIAGNOSIS — M25561 Pain in right knee: Secondary | ICD-10-CM | POA: Diagnosis present

## 2021-12-24 DIAGNOSIS — M25532 Pain in left wrist: Secondary | ICD-10-CM | POA: Insufficient documentation

## 2021-12-24 DIAGNOSIS — Z79899 Other long term (current) drug therapy: Secondary | ICD-10-CM | POA: Insufficient documentation

## 2021-12-24 DIAGNOSIS — W010XXA Fall on same level from slipping, tripping and stumbling without subsequent striking against object, initial encounter: Secondary | ICD-10-CM | POA: Diagnosis not present

## 2021-12-24 DIAGNOSIS — W19XXXA Unspecified fall, initial encounter: Secondary | ICD-10-CM
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
## 2021-12-24 NOTE — ED Notes (Signed)
Patient and wife declined crutches, brought their own in from home. Provided knee immobilizer.

## 2021-12-24 NOTE — Discharge Instructions (Signed)
CT scan right knee shows no evidence of fracture.  Knee immobilizer for comfort and crutches as needed.  Follow-up with sports medicine for the swelling to the left wrist area.  Okay to weight-bear on the right leg as tolerated.  Take the pain medication that you have at home.

## 2021-12-24 NOTE — ED Triage Notes (Signed)
Patient states he tripped and fell x 2 days ago now complains of right knee pain and left arm pain. Circulation intact.

## 2021-12-30 ENCOUNTER — Ambulatory Visit: Payer: Medicare Other | Admitting: Psychiatry

## 2022-01-07 ENCOUNTER — Encounter: Payer: Self-pay | Admitting: Psychiatry

## 2022-01-07 ENCOUNTER — Ambulatory Visit (INDEPENDENT_AMBULATORY_CARE_PROVIDER_SITE_OTHER): Payer: Medicare Other | Admitting: Psychiatry

## 2022-01-07 DIAGNOSIS — F330 Major depressive disorder, recurrent, mild: Secondary | ICD-10-CM | POA: Diagnosis not present

## 2022-01-07 DIAGNOSIS — G4733 Obstructive sleep apnea (adult) (pediatric): Secondary | ICD-10-CM | POA: Diagnosis not present

## 2022-01-07 MED ORDER — ARMODAFINIL 250 MG PO TABS: 250.0000 mg | ORAL_TABLET | Freq: Every day | ORAL | 1 refills | Status: DC

## 2022-01-07 NOTE — Progress Notes (Signed)
Theodore Miller [id number] [birth year] 71 y.o.  Subjective:   Patient ID:  Theodore Miller is a 71 y.o. (DOB 17-Nov-1950) male.  Chief Complaint:  Chief Complaint  Patient presents with   Follow-up    Depression, Anxiety    HPI Theodore Miller presents to the office today for follow-up of depression and anxiety. He reports that he was injured during trip to [place] and the trip was cut short. He reports that a few days before his trip his urologist started an indwelling catheter 2 months ago and he did not expect this. He reports that he had a brain scan that indicated he has Parkinsons. He also had a dental issue.  He may be getting an implant to help with sleep apnea since he has not been able to tolerate cPap.   He denies any significant change in mood- "nothing's worse." Denies any significant anxiety. He reports that he is taking Alprazolam about once a week. Energy has been ok. Describes motivation as "alright." He has been working in the garden and interacting with grandchildren. Denies anhedonia. He reports that he sleeps in intervals and thinks that he gets an adequate amount of sleep. He reports that his appetite has been more after trip to [place]. He reports that he has some difficulty with concentration and that he has challenges with "simple math" and word finding. He reports that he had to "practice" what he was going to say during appointment to make sure he could remember what has happened and the names of some details. Denies SI- "I will fight for that last breath."   Armodafinil last filled 09/17/21.   Past Psychiatric Medication Trials: Prozac- Taken since 2009. Denies any change in response. Lexapro- anxiety Cymbalta Wellbutrin Nuvigil Xanax Lamictal Depakote ER Rozerem Gabapentin- Prescribed for neuropathy Abilify- Reportedly more irritability  Flowsheet Row ED from 12/24/2021 in [facility] from 08/01/2021 in [facility] No Risk No Risk        Review of Systems:  Review of Systems  Musculoskeletal:  Negative for gait problem.  Neurological:  Positive for tremors.  Psychiatric/Behavioral:         Please refer to HPI    Medications: I have reviewed the patient's current medications.  Current Outpatient Medications  Medication Sig Dispense Refill   ALPRAZolam (XANAX) 0.5 MG tablet Take 1 tablet (0.5 mg total) by mouth 3 (three) times daily as needed for anxiety. 90 tablet 0   buPROPion (WELLBUTRIN XL) 150 MG 24 hr tablet TAKE 2 TO 3 TABLETS BY MOUTH  DAILY 270 tablet 3   carbidopa-levodopa (SINEMET IR) 10-100 MG tablet Take by mouth.     cholecalciferol (VITAMIN D) 1000 units tablet Take 1,000 Units by mouth daily.     docusate sodium (COLACE) 100 MG capsule Take 100 mg by mouth daily.     esomeprazole (NEXIUM) 40 MG capsule Take 40 mg by mouth daily.      FLUoxetine (PROZAC) 20 MG capsule TAKE 3 CAPSULES BY MOUTH DAILY 270 capsule 3   gabapentin (NEURONTIN) 300 MG capsule Take by mouth.     lisinopril (PRINIVIL,ZESTRIL) 5 MG tablet Take 5 mg by mouth daily.     Oxycodone HCl 10 MG TABS Take 10 mg by mouth 5 (five) times daily.     rosuvastatin (CRESTOR) 20 MG tablet Take 20 mg by mouth every evening.     tamsulosin (FLOMAX) 0.4 MG CAPS capsule Take  by mouth.     topiramate (TOPAMAX) 50 MG tablet Take 50 mg by mouth 2 (two) times daily.     vitamin C (ASCORBIC ACID) 500 MG tablet Take 500 mg by mouth daily.      XTAMPZA ER 13.5 MG C12A Take 1 capsule by mouth 2 (two) times daily.     Armodafinil 250 MG tablet Take 1 tablet (250 mg total) by mouth daily. 90 tablet 1   No current facility-administered medications for this visit.   Facility-Administered Medications Ordered in Other Visits  Medication Dose Route Frequency Provider Last Rate Last Admin   ondansetron (ZOFRAN) 4 mg in sodium chloride 0.9 % 50 mL IVPB  4 mg Intravenous Q6H PRN Ashok Pall, MD         Medication Side Effects: None  Allergies: No Known Allergies  Past Medical History:  Diagnosis Date   Back pain    Hyperlipidemia    Hypertension    Neck pain    Neuropathy     Past Medical History, Surgical history, Social history, and Family history were reviewed and updated as appropriate.   Please see review of systems for further details on the patient's review from today.   Objective:   Physical Exam:  There were no vitals taken for this visit.  Physical Exam Constitutional:      General: He is not in acute distress. Musculoskeletal:        General: No deformity.  Neurological:     Mental Status: He is alert and oriented to person, place, and time.     Coordination: Coordination normal.  Psychiatric:        Attention and Perception: Attention and perception normal. He does not perceive auditory or visual hallucinations.        Mood and Affect: Mood normal. Mood is not anxious or depressed. Affect is not labile, blunt, angry or inappropriate.        Speech: Speech normal.        Behavior: Behavior normal.        Thought Content: Thought content normal. Thought content is not paranoid or delusional. Thought content does not include homicidal or suicidal ideation. Thought content does not include homicidal or suicidal plan.        Cognition and Memory: Cognition and memory normal.        Judgment: Judgment normal.     Comments: Insight intact     Lab Review:     Component Value Date/Time   NA 137 08/01/2021 0615   K 4.5 08/01/2021 0615   CL 106 08/01/2021 0615   CO2 23 08/01/2021 0615   GLUCOSE 163 (H) 08/01/2021 0615   BUN 32 (H) 08/01/2021 0615   CREATININE 1.57 (H) 08/01/2021 0615   CALCIUM 9.1 08/01/2021 0615   PROT 7.4 08/01/2021 0615   ALBUMIN 4.3 08/01/2021 0615   AST 29 08/01/2021 0615   ALT 38 08/01/2021 0615   ALKPHOS 68 08/01/2021 0615   BILITOT 0.4 08/01/2021 0615   GFRNONAA 47 (L) 08/01/2021 0615   GFRAA  08/15/2007 0435    >60         The eGFR has been calculated using the MDRD equation. This calculation has not been validated in all clinical       Component Value Date/Time   WBC 6.6 08/01/2021 0615   RBC 3.72 (L) 08/01/2021 0615   HGB 12.7 (L) 08/01/2021 0615   HCT 37.2 (L) 08/01/2021 0615   PLT 141 (L) 08/01/2021 0615  MCV 100.0 08/01/2021 0615   MCH 34.1 (H) 08/01/2021 0615   MCHC 34.1 08/01/2021 0615   RDW 12.6 08/01/2021 0615   LYMPHSABS 1.4 08/01/2021 0615   MONOABS 0.5 08/01/2021 0615   EOSABS 0.1 08/01/2021 0615   BASOSABS 0.0 08/01/2021 0615    No results found for: "POCLITH", "LITHIUM"   Lab Results  Component Value Date   VALPROATE 65.3 08/15/2007     .res Assessment: Plan:   Pt seen for 30 minutes and time spent discussing recent changes in medical history and treatment considerations. Discussed that some psychiatric medications can cause urinary retention and to notify this provider if his urologist has any concerns about medications possibly causing or contributing to urinary issues. Discussed diagnosis of Parkinson's and tremors, and that higher doses of Wellbutrin XL can sometimes cause tremor. He is unsure if he is taking two or three Wellbutrin XL 150 mg tabs daily and will check with his wife who prepares his medications for him. Discussed that if he is taking 3 tabs daily they may want to try to reduce to 2 tabs daily to determine if this is helpful for his tremor.  He reports that he rarely takes Xanax and script has not been sent since 12/19/19.He denies need for a new script at this time.  Continue Prozac 60 mg po qd for depression and anxiety. Pt to follow-up in 6 months or sooner if clinically indicated.  Patient advised to contact office with any questions, adverse effects, or acute worsening in signs and symptoms.    Braidon was seen today for follow-up.  Diagnoses and all orders for this visit:  Mild episode of recurrent major depressive disorder (HCC) -     Armodafinil 250  MG tablet; Take 1 tablet (250 mg total) by mouth daily.  Obstructive sleep apnea syndrome -     Armodafinil 250 MG tablet; Take 1 tablet (250 mg total) by mouth daily.     Please see After Visit Summary for patient specific instructions.  Future Appointments  Date Time Provider [place]  07/11/2022 10:00 AM Thayer Headings, PMHNP [facility] None    No orders of the defined types were placed in this encounter.   -------------------------------

## 2022-01-28 ENCOUNTER — Other Ambulatory Visit: Payer: Self-pay | Admitting: Urology

## 2022-02-01 ENCOUNTER — Encounter (HOSPITAL_BASED_OUTPATIENT_CLINIC_OR_DEPARTMENT_OTHER): Payer: Self-pay | Admitting: Urology

## 2022-02-02 ENCOUNTER — Encounter (HOSPITAL_BASED_OUTPATIENT_CLINIC_OR_DEPARTMENT_OTHER): Payer: Self-pay | Admitting: Urology

## 2022-02-02 NOTE — Progress Notes (Signed)
Spoke w/ via phone for pre-op interview--- pt's wife, Theodore Miller needs dos----  no             Miller results------ pt has current Miller results in epic dated 01-19-2022  CBCdiff/ CMP ;  current ekg in epic/ chart COVID test -----patient states asymptomatic no test needed Arrive at ------- 0530 on 02-11-2022 NPO after MN NO Solid Food.  Clear liquids from MN until---  0430 Med rec completed Medications to take morning of surgery ----- wellbutrin, gabapentin, levorphanol, nexium, topamax Diabetic medication ----- n/a Patient instructed no nail polish to be worn day of surgery Patient instructed to bring photo id and insurance card day of surgery Patient aware to have Driver (ride ) / caregiver for 24 hours after surgery --- wife, Theodore Miller Patient Special Instructions ----- reveiwed [facility] and visitor guidelines Pre-Op special Istructions -----  pt has a foley cath in place.  Pt has severe OSA not uses bipap due to intolerance but does need to use several pillows.  Patient verbalized understanding of instructions that were given at this phone interview. Patient denies shortness of breath, chest pain, fever, cough at this phone interview.

## 2022-02-10 NOTE — Anesthesia Preprocedure Evaluation (Addendum)
Anesthesia Evaluation  Patient identified by MRN, date of birth, ID band Patient awake    Reviewed: Allergy & Precautions, NPO status , Patient's Chart, lab work & pertinent test results  History of Anesthesia Complications Negative for: history of anesthetic complications  Airway Mallampati: II  TM Distance: >3 FB Neck ROM: Limited    Dental  (+) Dental Advisory Given, Teeth Intact   Pulmonary sleep apnea , former smoker,    Pulmonary exam normal        Cardiovascular hypertension, Normal cardiovascular exam     Neuro/Psych Anxiety Depression Parkinson's disease    GI/Hepatic Neg liver ROS, PUD, S/p gastric bypass   Endo/Other  negative endocrine ROS  Renal/GU negative Renal ROS  negative genitourinary   Musculoskeletal negative musculoskeletal ROS (+)   Abdominal   Peds  Hematology negative hematology ROS (+)   Anesthesia Other Findings   Reproductive/Obstetrics                          Anesthesia Physical Anesthesia Plan  ASA: 2  Anesthesia Plan: General   Post-op Pain Management: Tylenol PO (pre-op)*   Induction: Intravenous  PONV Risk Score and Plan: 2 and Ondansetron, Dexamethasone, Midazolam and Treatment may vary due to age or medical condition  Airway Management Planned: LMA  Additional Equipment: None  Intra-op Plan:   Post-operative Plan: Extubation in OR  Informed Consent: I have reviewed the patients History and Physical, chart, labs and discussed the procedure including the risks, benefits and alternatives for the proposed anesthesia with the patient or authorized representative who has indicated his/her understanding and acceptance.     Dental advisory given  Plan Discussed with:   Anesthesia Plan Comments:        Anesthesia Quick Evaluation

## 2022-02-11 ENCOUNTER — Encounter (HOSPITAL_BASED_OUTPATIENT_CLINIC_OR_DEPARTMENT_OTHER)
Admission: RE | Disposition: A | Discharge status: Home / Self Care | Payer: Self-pay | Source: Ambulatory Visit | Attending: Urology

## 2022-02-11 ENCOUNTER — Other Ambulatory Visit: Payer: Self-pay

## 2022-02-11 ENCOUNTER — Observation Stay (HOSPITAL_BASED_OUTPATIENT_CLINIC_OR_DEPARTMENT_OTHER)
Admission: RE | Admit: 2022-02-11 | Discharge: 2022-02-12 | Disposition: A | Discharge status: Home / Self Care | Payer: Medicare Other | Source: Ambulatory Visit | Attending: Urology | Admitting: Urology

## 2022-02-11 ENCOUNTER — Observation Stay (HOSPITAL_BASED_OUTPATIENT_CLINIC_OR_DEPARTMENT_OTHER): Payer: Medicare Other | Admitting: Anesthesiology

## 2022-02-11 ENCOUNTER — Encounter (HOSPITAL_BASED_OUTPATIENT_CLINIC_OR_DEPARTMENT_OTHER): Payer: Self-pay | Admitting: Urology

## 2022-02-11 DIAGNOSIS — N138 Other obstructive and reflux uropathy: Secondary | ICD-10-CM | POA: Diagnosis not present

## 2022-02-11 DIAGNOSIS — G20 Parkinson's disease: Secondary | ICD-10-CM | POA: Diagnosis not present

## 2022-02-11 DIAGNOSIS — N400 Enlarged prostate without lower urinary tract symptoms: Secondary | ICD-10-CM

## 2022-02-11 DIAGNOSIS — Z79899 Other long term (current) drug therapy: Secondary | ICD-10-CM | POA: Diagnosis not present

## 2022-02-11 DIAGNOSIS — Z01818 Encounter for other preprocedural examination: Secondary | ICD-10-CM

## 2022-02-11 DIAGNOSIS — N319 Neuromuscular dysfunction of bladder, unspecified: Secondary | ICD-10-CM | POA: Insufficient documentation

## 2022-02-11 DIAGNOSIS — N401 Enlarged prostate with lower urinary tract symptoms: Principal | ICD-10-CM | POA: Insufficient documentation

## 2022-02-11 DIAGNOSIS — I10 Essential (primary) hypertension: Secondary | ICD-10-CM | POA: Diagnosis not present

## 2022-02-11 DIAGNOSIS — Z87891 Personal history of nicotine dependence: Secondary | ICD-10-CM | POA: Insufficient documentation

## 2022-02-11 HISTORY — DX: Presence of other specified devices: Z97.8

## 2022-02-11 HISTORY — DX: Generalized anxiety disorder: F41.1

## 2022-02-11 HISTORY — PX: TRANSURETHRAL RESECTION OF PROSTATE: SHX73

## 2022-02-11 HISTORY — DX: Personal history of other specified conditions: Z87.898

## 2022-02-11 HISTORY — DX: Major depressive disorder, single episode, unspecified: F32.9

## 2022-02-11 HISTORY — DX: Personal history of diseases of the blood and blood-forming organs and certain disorders involving the immune mechanism: Z86.2

## 2022-02-11 HISTORY — DX: Deficiency of other specified B group vitamins: E53.8

## 2022-02-11 HISTORY — DX: Obstructive sleep apnea (adult) (pediatric): G47.33

## 2022-02-11 HISTORY — DX: Benign prostatic hyperplasia with lower urinary tract symptoms: N40.1

## 2022-02-11 HISTORY — DX: Essential tremor: G25.0

## 2022-02-11 HISTORY — DX: Parkinson's disease without dyskinesia, without mention of fluctuations: G20.A1

## 2022-02-11 HISTORY — DX: Postsurgical malabsorption, not elsewhere classified: K91.2

## 2022-02-11 HISTORY — DX: Parkinson's disease: G20

## 2022-02-11 HISTORY — DX: Chronic pain syndrome: G89.4

## 2022-02-11 SURGERY — TURP (TRANSURETHRAL RESECTION OF PROSTATE)
Anesthesia: General | Site: Prostate

## 2022-02-11 MED ORDER — EPHEDRINE 5 MG/ML INJ
INTRAVENOUS | Status: AC
  Filled 2022-02-11: qty 10

## 2022-02-11 MED ORDER — ACETAMINOPHEN 500 MG PO TABS
ORAL_TABLET | ORAL | Status: AC
  Filled 2022-02-11: qty 2

## 2022-02-11 MED ORDER — PROPOFOL 10 MG/ML IV BOLUS
INTRAVENOUS | Status: AC
  Filled 2022-02-11: qty 20

## 2022-02-11 MED ORDER — PROPOFOL 10 MG/ML IV BOLUS
INTRAVENOUS | Status: DC | PRN
  Administered 2022-02-11: 200 mg via INTRAVENOUS
  Administered 2022-02-11: 50 mg via INTRAVENOUS

## 2022-02-11 MED ORDER — FENTANYL CITRATE (PF) 100 MCG/2ML IJ SOLN
25.0000 ug | INTRAMUSCULAR | Status: DC | PRN
  Administered 2022-02-11 (×4): 25 ug via INTRAVENOUS

## 2022-02-11 MED ORDER — DEXAMETHASONE SODIUM PHOSPHATE 10 MG/ML IJ SOLN
INTRAMUSCULAR | Status: AC
  Filled 2022-02-11: qty 1

## 2022-02-11 MED ORDER — SODIUM CHLORIDE 0.9 % IR SOLN
3000.0000 mL | Status: DC
  Administered 2022-02-11: 3000 mL

## 2022-02-11 MED ORDER — BARIATRIC MULTIVITAMINS/IRON PO CAPS
ORAL_CAPSULE | Freq: Every day | ORAL | Status: DC
Start: 2022-02-11 — End: 2022-02-11

## 2022-02-11 MED ORDER — OXYCODONE HCL 5 MG PO TABS
ORAL_TABLET | ORAL | Status: AC
  Filled 2022-02-11: qty 2

## 2022-02-11 MED ORDER — 0.9 % SODIUM CHLORIDE (POUR BTL) OPTIME
TOPICAL | Status: DC | PRN
  Administered 2022-02-11: 500 mL

## 2022-02-11 MED ORDER — ONDANSETRON HCL 4 MG/2ML IJ SOLN: 4.0000 mg | Freq: Once | INTRAMUSCULAR | Status: DC | PRN

## 2022-02-11 MED ORDER — LIDOCAINE 2% (20 MG/ML) 5 ML SYRINGE
INTRAMUSCULAR | Status: DC | PRN
  Administered 2022-02-11: 100 mg via INTRAVENOUS

## 2022-02-11 MED ORDER — GLYCOPYRROLATE PF 0.2 MG/ML IJ SOSY
PREFILLED_SYRINGE | INTRAMUSCULAR | Status: DC | PRN
  Administered 2022-02-11 (×2): .2 mg via INTRAVENOUS

## 2022-02-11 MED ORDER — AMISULPRIDE (ANTIEMETIC) 5 MG/2ML IV SOLN: 10.0000 mg | Freq: Once | INTRAVENOUS | Status: DC | PRN

## 2022-02-11 MED ORDER — LISINOPRIL 5 MG PO TABS
5.0000 mg | ORAL_TABLET | Freq: Every day | ORAL | Status: DC
  Administered 2022-02-11: 5 mg via ORAL
  Filled 2022-02-11: qty 1

## 2022-02-11 MED ORDER — OXYCODONE HCL 5 MG PO TABS
10.0000 mg | ORAL_TABLET | ORAL | Status: DC | PRN
  Administered 2022-02-11 – 2022-02-12 (×5): 10 mg via ORAL

## 2022-02-11 MED ORDER — LACTATED RINGERS IV SOLN: INTRAVENOUS | Status: DC

## 2022-02-11 MED ORDER — MIDAZOLAM HCL 2 MG/2ML IJ SOLN
INTRAMUSCULAR | Status: AC
  Filled 2022-02-11: qty 2

## 2022-02-11 MED ORDER — TAMSULOSIN HCL 0.4 MG PO CAPS
0.4000 mg | ORAL_CAPSULE | Freq: Every day | ORAL | Status: DC
  Administered 2022-02-11: 0.4 mg via ORAL

## 2022-02-11 MED ORDER — GLYCOPYRROLATE PF 0.2 MG/ML IJ SOSY
PREFILLED_SYRINGE | INTRAMUSCULAR | Status: AC
  Filled 2022-02-11: qty 1

## 2022-02-11 MED ORDER — SODIUM CHLORIDE 0.9 % IR SOLN
Status: DC | PRN
  Administered 2022-02-11: 3000 mL via INTRAVESICAL
  Administered 2022-02-11 (×3): 6000 mL via INTRAVESICAL

## 2022-02-11 MED ORDER — ZOLPIDEM TARTRATE 5 MG PO TABS: 5.0000 mg | ORAL_TABLET | Freq: Every evening | ORAL | Status: DC | PRN

## 2022-02-11 MED ORDER — PHENYLEPHRINE 80 MCG/ML (10ML) SYRINGE FOR IV PUSH (FOR BLOOD PRESSURE SUPPORT)
PREFILLED_SYRINGE | INTRAVENOUS | Status: AC
  Filled 2022-02-11: qty 10

## 2022-02-11 MED ORDER — CEFAZOLIN SODIUM-DEXTROSE 2-4 GM/100ML-% IV SOLN
2.0000 g | Freq: Once | INTRAVENOUS | Status: AC
  Administered 2022-02-11: 2 g via INTRAVENOUS

## 2022-02-11 MED ORDER — ADVANCED MULTI EA PO CHEW
2.0000 | CHEWABLE_TABLET | Freq: Every day | ORAL | Status: DC
  Filled 2022-02-11: qty 2

## 2022-02-11 MED ORDER — PANTOPRAZOLE SODIUM 40 MG PO TBEC
DELAYED_RELEASE_TABLET | ORAL | Status: AC
  Filled 2022-02-11: qty 1

## 2022-02-11 MED ORDER — ACETAMINOPHEN 500 MG PO TABS
1000.0000 mg | ORAL_TABLET | Freq: Once | ORAL | Status: AC
Start: 2022-02-11 — End: 2022-02-11
  Administered 2022-02-11: 1000 mg via ORAL

## 2022-02-11 MED ORDER — GABAPENTIN 300 MG PO CAPS
ORAL_CAPSULE | ORAL | Status: AC
  Filled 2022-02-11: qty 1

## 2022-02-11 MED ORDER — DIPHENHYDRAMINE HCL 50 MG/ML IJ SOLN: 12.5000 mg | Freq: Four times a day (QID) | INTRAMUSCULAR | Status: DC | PRN

## 2022-02-11 MED ORDER — ACETAMINOPHEN 325 MG PO TABS: 650.0000 mg | ORAL_TABLET | ORAL | Status: DC | PRN

## 2022-02-11 MED ORDER — PANTOPRAZOLE SODIUM 40 MG PO TBEC
40.0000 mg | DELAYED_RELEASE_TABLET | Freq: Every day | ORAL | Status: DC
  Administered 2022-02-11: 40 mg via ORAL

## 2022-02-11 MED ORDER — DIPHENHYDRAMINE HCL 12.5 MG/5ML PO ELIX: 12.5000 mg | ORAL_SOLUTION | Freq: Four times a day (QID) | ORAL | Status: DC | PRN

## 2022-02-11 MED ORDER — BUPROPION HCL ER (XL) 150 MG PO TB24
300.0000 mg | ORAL_TABLET | Freq: Every day | ORAL | Status: DC
  Administered 2022-02-11: 300 mg via ORAL
  Filled 2022-02-11: qty 2

## 2022-02-11 MED ORDER — OXYBUTYNIN CHLORIDE 5 MG PO TABS: 5.0000 mg | ORAL_TABLET | Freq: Three times a day (TID) | ORAL | Status: DC | PRN

## 2022-02-11 MED ORDER — DEXAMETHASONE SODIUM PHOSPHATE 10 MG/ML IJ SOLN
INTRAMUSCULAR | Status: DC | PRN
  Administered 2022-02-11: 10 mg via INTRAVENOUS

## 2022-02-11 MED ORDER — ARMODAFINIL 250 MG PO TABS: 250.0000 mg | ORAL_TABLET | Freq: Two times a day (BID) | ORAL | Status: DC | PRN

## 2022-02-11 MED ORDER — TRIPLE ANTIBIOTIC 3.5-400-5000 EX OINT: 1.0000 | TOPICAL_OINTMENT | Freq: Three times a day (TID) | CUTANEOUS | Status: DC | PRN

## 2022-02-11 MED ORDER — SODIUM CHLORIDE 0.45 % IV SOLN: INTRAVENOUS | Status: DC

## 2022-02-11 MED ORDER — ONDANSETRON HCL 4 MG/2ML IJ SOLN
INTRAMUSCULAR | Status: DC | PRN
  Administered 2022-02-11: 4 mg via INTRAVENOUS

## 2022-02-11 MED ORDER — TOPIRAMATE 25 MG PO TABS
50.0000 mg | ORAL_TABLET | Freq: Two times a day (BID) | ORAL | Status: DC
  Administered 2022-02-11 (×2): 50 mg via ORAL
  Filled 2022-02-11 (×2): qty 2

## 2022-02-11 MED ORDER — FENTANYL CITRATE (PF) 100 MCG/2ML IJ SOLN
INTRAMUSCULAR | Status: DC | PRN
  Administered 2022-02-11: 50 ug via INTRAVENOUS

## 2022-02-11 MED ORDER — BISACODYL 10 MG RE SUPP: 10.0000 mg | Freq: Every day | RECTAL | Status: DC | PRN

## 2022-02-11 MED ORDER — OXYCODONE HCL 5 MG/5ML PO SOLN: 5.0000 mg | Freq: Once | ORAL | Status: DC | PRN

## 2022-02-11 MED ORDER — LEVORPHANOL TARTRATE 2 MG PO TABS: 2.0000 mg | ORAL_TABLET | Freq: Two times a day (BID) | ORAL | Status: DC

## 2022-02-11 MED ORDER — HYDROMORPHONE HCL 1 MG/ML IJ SOLN: 0.5000 mg | INTRAMUSCULAR | Status: DC | PRN

## 2022-02-11 MED ORDER — GABAPENTIN 300 MG PO CAPS
300.0000 mg | ORAL_CAPSULE | Freq: Three times a day (TID) | ORAL | Status: DC
  Administered 2022-02-11 – 2022-02-12 (×3): 300 mg via ORAL

## 2022-02-11 MED ORDER — CEFAZOLIN SODIUM-DEXTROSE 2-4 GM/100ML-% IV SOLN
INTRAVENOUS | Status: AC
  Filled 2022-02-11: qty 100

## 2022-02-11 MED ORDER — OXYCODONE HCL 5 MG PO TABS: 5.0000 mg | ORAL_TABLET | Freq: Once | ORAL | Status: DC | PRN

## 2022-02-11 MED ORDER — EPHEDRINE SULFATE-NACL 50-0.9 MG/10ML-% IV SOSY
PREFILLED_SYRINGE | INTRAVENOUS | Status: DC | PRN
  Administered 2022-02-11: 5 mg via INTRAVENOUS
  Administered 2022-02-11: 15 mg via INTRAVENOUS

## 2022-02-11 MED ORDER — ONDANSETRON HCL 4 MG/2ML IJ SOLN
INTRAMUSCULAR | Status: AC
  Filled 2022-02-11: qty 2

## 2022-02-11 MED ORDER — FENTANYL CITRATE (PF) 100 MCG/2ML IJ SOLN
INTRAMUSCULAR | Status: AC
  Filled 2022-02-11: qty 2

## 2022-02-11 MED ORDER — STERILE WATER FOR IRRIGATION IR SOLN
Status: DC | PRN
  Administered 2022-02-11: 500 mL

## 2022-02-11 MED ORDER — OXYCODONE HCL 5 MG PO TABS: 5.0000 mg | ORAL_TABLET | ORAL | Status: DC | PRN

## 2022-02-11 MED ORDER — FLUOXETINE HCL 20 MG PO CAPS
60.0000 mg | ORAL_CAPSULE | Freq: Every day | ORAL | Status: DC
  Administered 2022-02-11: 60 mg via ORAL
  Filled 2022-02-11: qty 3

## 2022-02-11 MED ORDER — ONDANSETRON HCL 4 MG/2ML IJ SOLN: 4.0000 mg | INTRAMUSCULAR | Status: DC | PRN

## 2022-02-11 MED ORDER — ROSUVASTATIN CALCIUM 20 MG PO TABS
20.0000 mg | ORAL_TABLET | Freq: Every evening | ORAL | Status: DC
  Administered 2022-02-11: 20 mg via ORAL
  Filled 2022-02-11: qty 1

## 2022-02-11 MED ORDER — ALPRAZOLAM 0.5 MG PO TABS: 0.5000 mg | ORAL_TABLET | Freq: Two times a day (BID) | ORAL | Status: DC | PRN

## 2022-02-11 MED ORDER — POLYETHYLENE GLYCOL 3350 17 G PO PACK: 17.0000 g | PACK | Freq: Every day | ORAL | Status: DC | PRN

## 2022-02-11 MED ORDER — TAMSULOSIN HCL 0.4 MG PO CAPS
ORAL_CAPSULE | ORAL | Status: AC
  Filled 2022-02-11: qty 1

## 2022-02-11 MED ORDER — CEPHALEXIN 500 MG PO CAPS: 500.0000 mg | ORAL_CAPSULE | Freq: Two times a day (BID) | ORAL | 0 refills | Status: AC

## 2022-02-11 SURGICAL SUPPLY — 25 items
BAG DRAIN URO-CYSTO SKYTR STRL (DRAIN) ×1 IMPLANT
BAG DRN RND TRDRP ANRFLXCHMBR (UROLOGICAL SUPPLIES) ×1
BAG DRN UROCATH (DRAIN) ×1
BAG URINE DRAIN 2000ML AR STRL (UROLOGICAL SUPPLIES) ×1 IMPLANT
BAG URINE LEG 500ML (DRAIN) IMPLANT
CATH FOLEY 2WAY SLVR 30CC 20FR (CATHETERS) IMPLANT
CATH FOLEY 3WAY 30CC 22FR (CATHETERS) ×1 IMPLANT
CLOTH BEACON ORANGE TIMEOUT ST (SAFETY) ×1 IMPLANT
ELECT BIVAP BIPO 22/24 DONUT (ELECTROSURGICAL) ×1
ELECT REM PT RETURN 9FT ADLT (ELECTROSURGICAL)
ELECTRD BIVAP BIPO 22/24 DONUT (ELECTROSURGICAL) IMPLANT
ELECTRODE REM PT RTRN 9FT ADLT (ELECTROSURGICAL) IMPLANT
GLOVE BIO SURGEON STRL SZ7 (GLOVE) ×1 IMPLANT
GOWN STRL REUS W/TWL LRG LVL3 (GOWN DISPOSABLE) ×1 IMPLANT
HOLDER FOLEY CATH W/STRAP (MISCELLANEOUS) ×1 IMPLANT
IV NS IRRIG 3000ML ARTHROMATIC (IV SOLUTION) ×2 IMPLANT
KIT TURNOVER CYSTO (KITS) ×1 IMPLANT
LOOP CUT BIPOLAR 24F LRG (ELECTROSURGICAL) ×1 IMPLANT
MANIFOLD NEPTUNE II (INSTRUMENTS) ×1 IMPLANT
PACK CYSTO (CUSTOM PROCEDURE TRAY) ×1 IMPLANT
SYR 30ML LL (SYRINGE) ×1 IMPLANT
SYR TOOMEY IRRIG 70ML (MISCELLANEOUS) ×1
SYRINGE TOOMEY IRRIG 70ML (MISCELLANEOUS) ×1 IMPLANT
TUBE CONNECTING 12X1/4 (SUCTIONS) ×1 IMPLANT
TUBING UROLOGY SET (TUBING) ×1 IMPLANT

## 2022-02-11 NOTE — Anesthesia Postprocedure Evaluation (Signed)
Anesthesia Post Note  Patient: JANATHAN BRIBIESCA  Procedure(s) Performed: TRANSURETHRAL RESECTION OF THE PROSTATE (TURP) (Prostate)     Patient location during evaluation: PACU Anesthesia Type: General Level of consciousness: awake and alert Pain management: pain level controlled Vital Signs Assessment: post-procedure vital signs reviewed and stable Respiratory status: spontaneous breathing, nonlabored ventilation and respiratory function stable Cardiovascular status: blood pressure returned to baseline and stable Postop Assessment: no apparent nausea or vomiting Anesthetic complications: no   No notable events documented.  Last Vitals:  Vitals:   02/11/22 0915 02/11/22 0930  BP: (!) 122/58 (!) 138/58  Pulse: 62 81  Resp: 13 18  Temp:  (!) 36.4 C  SpO2: 96% 96%    Last Pain:  Vitals:   02/11/22 0930  TempSrc:   PainSc: 4                  Lidia Collum

## 2022-02-11 NOTE — Discharge Instructions (Signed)

## 2022-02-11 NOTE — H&P (Signed)
Office Visit Report     01/14/2022   --------------------------------------------------------------------------------   Theodore Miller  MRN: [id number]  DOB: [birth year], 71 year old Male  SSN:    PRIMARY CARE:  Wonda Cerise, MD  REFERRING:  Ammie Dalton, NP  PROVIDER:  Rexene Alberts, M.D.  TREATING:  Jiles Crocker, NP  LOCATION:  [facility] [phone]     --------------------------------------------------------------------------------   CC/HPI: Theodore Miller is a 71 year old male seen in followup today for areflexic bladder.   1. Areflexic bladder:  -He presented the ED in 07/2021 with a fall. CT C/A/P was performed demonstrating a chronically distended bladder.  -Prostate volume by CT 07/2021 measuring 3.7 x 3.9 x 3.4 cm equating to a 25 cc prostate.  -PVR 08/2021 was over 1 L.  -He adamantly declined Foley catheter placement in 08/2021. He was started on tamsulosin 0.4 mg in 08/2021.  -He returned in 11/2021 stating that he had a reasonable flow stream. He reported a strong flow stream in the morning at slow throughout the day. He denied bothersome frequency or urgency. He has 1 time nocturia. His IPSS score is 12, quality-of-life 2.  -He denied prior history of UTI, STI, urolithiasis, gross hematuria. He denies history of prostate cancer or family history of prostate cancer.  PVR in 11/2021 was greater than 1 L. Foley catheter was placed with a trial with 1.2 L clear yellow urine.  -UDS 12/28/2021 with max capacity 1 L with several unstable contractions. He was able to generate a voluntary contraction and void 71 mL with max flow 3 mL/second. PVR was 926 mL. He did generate detrusor pressure at peak flow was 38 cm H2O. He voided with an equivocal obstruction pattern.  -TRUS volume 01/13/2022: 36 mL.  -Cystoscopy 01/13/2022 with mildly obstructing prostate, severely trabeculated and large capacity bladder.   #2. Prostate cancer screening: PSA in 08/2021 was 0.11. He denies  a family history of prostate cancer.   Patient currently denies fever, chills, sweats, nausea, vomiting, abdominal or flank pain, gross hematuria or dysuria.   He has a past medical history of hypertension, hyperlipidemia, depression, obstructive sleep apnea. He was diagnosed with Parkinson disease in June 2023. He has had a cholecystectomy and gastric bypass.   01/14/2022: Patient returns this afternoon. He was seen yesterday by Dr. Abner Greenspan. TURP versus continuing with long-term indwelling Foley catheter, suprapubic tube or CIC discussed for ongoing management in the setting of a areflexic bladder. Patient did not want to proceed with surgical intervention. He was taught CIC. Presents today continued need for CIC. He was instructed to perform self-catheterization up to 6 times daily at last visit. He has done this a couple times and not really had any difficulty with such but does endorse this afternoon that he is going to struggle with compliance and does not think he will be able to perform this on any sort of routine schedule. Understandably so, he is struggling with the fact that he has been diagnosed with a poorly functioning or neurogenic bladder. He is requesting at least for now to have his catheter replaced.     ALLERGIES: No Known Drug Allergies    MEDICATIONS: Lisinopril 5 mg tablet  Tamsulosin Hcl 0.4 mg capsule 1 capsule PO Q HS  Armodafinil 150 mg tablet  Bariatric Multivitamins  Bupropion Hcl Sr 150 mg tablet, extended release 12 hr  Esomeprazole Magnesium  Fluoxetine Hcl 20 mg tablet  Gabapentin 300 mg capsule  Oxycodone Hcl 10 mg tablet  Rosuvastatin Calcium 20 mg tablet  Stool Softener  Topiramate 50 mg tablet     GU PSH: Complex cystometrogram, w/ void pressure and urethral pressure profile studies, any technique - 12/28/2021 Complex Uroflow - 12/28/2021 Cystoscopy - 01/13/2022 Emg surf Electrd - 12/28/2021 Inject For cystogram - 12/28/2021 Intrabd voidng Press -  12/28/2021     NON-GU PSH: Gastric bypass Lumbar Laminectomy Remove Gallbladder     GU PMH: Bladder, Neuromuscular dysfunction, Unspec - 01/13/2022 BPH w/LUTS - 01/13/2022, - 12/09/2021, - 08/30/2021 Incomplete bladder emptying - 01/13/2022, - 12/28/2021, - 12/09/2021, - 08/30/2021 Encounter for Prostate Cancer screening - 12/09/2021, - 08/30/2021    NON-GU PMH: Depression Hypercholesterolemia Hypertension Sleep Apnea    FAMILY HISTORY: 2 daughters - Runs in Family   SOCIAL HISTORY: Marital Status: Married Ethnicity: Not Hispanic Or Latino; Race: White Current Smoking Status: Patient does not smoke anymore. Has not smoked since 08/04/1996.   Tobacco Use Assessment Completed: Used Tobacco in last 30 days? Has never drank.     REVIEW OF SYSTEMS:    GU Review Male:   Patient denies frequent urination, hard to postpone urination, burning/ pain with urination, get up at night to urinate, leakage of urine, stream starts and stops, trouble starting your stream, have to strain to urinate , erection problems, and penile pain.  Gastrointestinal (Upper):   Patient denies nausea, vomiting, and indigestion/ heartburn.  Gastrointestinal (Lower):   Patient denies diarrhea and constipation.  Constitutional:   Patient denies fever, night sweats, weight loss, and fatigue.  Skin:   Patient denies skin rash/ lesion and itching.  Eyes:   Patient denies blurred vision and double vision.  Ears/ Nose/ Throat:   Patient denies sore throat and sinus problems.  Hematologic/Lymphatic:   Patient denies swollen glands and easy bruising.  Cardiovascular:   Patient denies leg swelling and chest pains.  Respiratory:   Patient denies cough and shortness of breath.  Endocrine:   Patient denies excessive thirst.  Musculoskeletal:   Patient denies back pain and joint pain.  Neurological:   Patient denies headaches and dizziness.  Psychologic:   Patient denies depression and anxiety.   Notes: Patient stated he would like  to get the foley catheter back in the self caths not working for him.    VITAL SIGNS:      01/14/2022 12:41 PM  Weight 173 lb / 78.47 kg  Height 69 in / 175.26 cm  BP 136/67 mmHg  Pulse 57 /min  Temperature 98.6 F / 37 C  BMI 25.5 kg/m   MULTI-SYSTEM PHYSICAL EXAMINATION:    Constitutional: Well-nourished. No physical deformities. Normally developed. Good grooming.  Neck: Neck symmetrical, not swollen. Normal tracheal position.  Respiratory: No labored breathing, no use of accessory muscles.   Cardiovascular: Normal temperature, normal extremity pulses, no swelling, no varicosities.  Skin: No paleness, no jaundice, no cyanosis. No lesion, no ulcer, no rash.  Neurologic / Psychiatric: Oriented to time, oriented to place, oriented to person. No depression, no anxiety, no agitation.  Gastrointestinal: No mass, no tenderness, no rigidity, non obese abdomen.  Musculoskeletal: Normal gait and station of head and neck.     Complexity of Data:  Source Of History:  Patient, Medical Record Summary  Records Review:   Previous Doctor Records, Previous Hospital Records, Previous Patient Records  Urine Test Review:   Urinalysis, Urine Culture  Urodynamics Review:   Review Bladder Scan, Review Urodynamics Tests  X-Ray Review: Prostate Ultrasound: Reviewed Films.     08/30/21  PSA  Total PSA 0.11 ng/mL    PROCEDURES:         Simple Foley Indwelling Cath Change - 51702  The patient's indwelling foley tube was carefully removed. A 16 French Foley catheter was inserted into the bladder using sterile technique. The patient was taught routine catheter care. Hand irrigation of the bladder with sterile water was performed. A leg bag was connected. 300 cc of urine was obtained.   ASSESSMENT:      ICD-10 Details  1 GU:   Bladder, Neuromuscular dysfunction, Unspec - N31.9 Chronic, Stable  2   BPH w/LUTS - N40.1 Chronic, Stable   PLAN:           Schedule Return Visit/Planned Activity: 1 Month -  Office Visit- No Urine             Note: Catheter exchange  Return Visit/Planned Activity: Keep Scheduled Appointment - Follow up MD          Document Letter(s):  Created for Patient: Clinical Summary         Notes:   Although not having any significant difficulty performing CIC, the patient does not think he will be able to do so from a compliance standpoint even with the reduced frequency which I mention, even at TID/QHS he does not think he will be able to do so. Risks associated with bladder overdistention especially in the setting of a known neurogenic bladder discussed including risk for upstream obstructive uropathy resulting in worsening kidney function, as well as increased risk for developing symptomatic lower urinary tract symptoms versus worsening systemic infection. We discussed other options including what was discussed yesterday with Dr. Abner Greenspan. This includes TURP, suprapubic tube. At this point the patient would like to just resume with indwelling Foley catheter. Catheter will be placed this afternoon. I will inform his urologist about today's conversation and visit. He will tentatively be scheduled to return again in a month for nurse visit catheter exchange. For now he will keep his scheduled follow-up with Dr. Abner Greenspan in November but noting today's findings, he may want to have him seen sooner.        Next Appointment:      Next Appointment: 02/11/2022 07:30 AM    Appointment Type: Surgery     Location: [facility] [phone]    Provider: Rexene Alberts, M.D.    Reason for Visit: NE/OP TURP    Urology Preoperative H&P   Chief Complaint: BPH  History of Present Illness: Theodore Miller is a 71 y.o. male with BPH here for TURP. Denies fevers, chills, dysuria.    Past Medical History:  Diagnosis Date   B12 deficiency    Benign essential tremor    Benign localized prostatic hyperplasia with lower urinary tract symptoms (LUTS)    urologist--- dr Markee Remlinger   Chronic  pain syndrome    neck and back   Foley catheter in place    GAD (generalized anxiety disorder)    History of gastric ulcer 02/2016   History of iron deficiency anemia    History of syncope    happened 01-15-2022 but did not go to ED ,  follow up w/ pcp 01-19-2022 note in epic ,  stated happeden when he stood up got dizziness / passed out;   told by pcp orthostatic hypotension with dehydration, stated lisinopril on hold for 2 wks , stated pt was not referred to cardiology   History of thrombocytopenia    Hyperlipidemia  Hypertension    followed by pcp    (stress echo 03-02-2019 in care everywhere normal LVF and wall motion,  no sig ST-T changes   MDD (major depressive disorder)    Neuropathy    OSA (obstructive sleep apnea)    followed by neuology--- retested 06-12-2020 in epic , severe osa w/ AHI 60.5/ hr,  used bipap but stopped,  pt has been referred to ENT for possible INSPIRE;  uses several pillows when sleeping   Parkinson disease [facility])    neurologist--- hope scales NP ([facility] in [place])   Postoperative malabsorption    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 No pertinent family history.  Social History:  reports that he quit smoking about 30 years ago. His smoking use included cigarettes. He has a 30.00 pack-year smoking history. He has never been exposed to tobacco smoke. He has never used smokeless tobacco. He reports that he does not drink alcohol and does not use drugs.  ROS: A complete review  of systems was performed.  All systems are negative except for pertinent findings as noted.  Physical Exam:  Vital signs in last 24 hours: Temp:  [97.4 F (36.3 C)] 97.4 F (36.3 C) (09/08 0548) Pulse Rate:  [43] 43 (09/08 0548) Resp:  [16] 16 (09/08 0548) BP: (124)/(51) 124/51 (09/08 0548) SpO2:  [100 %] 100 % (09/08 0548) Weight:  [81.8 kg] 81.8 kg (09/08 0548) Constitutional:  Alert and oriented, No acute distress Cardiovascular: Regular rate and rhythm Respiratory: Normal respiratory effort, Lungs clear bilaterally GI: Abdomen is soft, nontender, nondistended, no abdominal masses GU: No CVA tenderness Lymphatic: No lymphadenopathy Neurologic: Grossly intact, no focal deficits Psychiatric: Normal mood and affect  Laboratory Data:  No results for input(s): "WBC", "HGB", "HCT", "PLT" in the last 72 hours.  No results for input(s): "NA", "K", "CL", "GLUCOSE", "BUN", "CALCIUM", "CREATININE" in the last 72 hours.  Invalid input(s): "CO3"   No results found for this or any previous visit (from the past 24 hour(s)). No results found for this or any previous visit (from the past 240 hour(s)).  Renal Function: No results for input(s): "CREATININE" in the last 168 hours. CrCl cannot be calculated (Patient's most recent lab result is older than the maximum 21 days allowed.).  Radiologic Imaging: No results found.  I independently reviewed the above imaging studies.  Assessment and Plan Theodore Miller is a 71 y.o. male with BPH here for TURP.    Risks and benefits of Transurethral Resection of the Prostate were reviewed in detail including infection, bleeding, blood transfusion, injury to bladder/urethra/surrounding structures, erectile dysfunction, urinary incontinence, bladder neck contracture, persistent obstructive and irritative voiding symptoms, and global anesthesia risks including but not limited to CVA, MI, DVT, PE, pneumonia, and death. He expressed understanding and  desire to proceed.      Matt R. Krysta Bloomfield MD 02/11/2022, 7:19 AM  [facility] Pager: [phone]): [phone]

## 2022-02-11 NOTE — Anesthesia Procedure Notes (Signed)
Procedure Name: LMA Insertion Date/Time: 02/11/2022 7:39 AM  Performed by: Rogers Blocker, CRNAPre-anesthesia Checklist: Patient identified, Emergency Drugs available, Suction available and Patient being monitored Patient Re-evaluated:Patient Re-evaluated prior to induction Oxygen Delivery Method: Circle System Utilized Preoxygenation: Pre-oxygenation with 100% oxygen Induction Type: IV induction Ventilation: Mask ventilation without difficulty LMA: LMA inserted LMA Size: 5.0 Number of attempts: 1 Placement Confirmation: positive ETCO2 Tube secured with: Tape Dental Injury: Teeth and Oropharynx as per pre-operative assessment

## 2022-02-11 NOTE — Op Note (Signed)
Operative Note  Preoperative diagnosis:  1.  BPH with bladder outlet obstruction  Postoperative diagnosis: 1.  BPH with bladder outlet obstruction  Procedure(s): 1.  Bipolar transurethral resection of prostate  Surgeon: Rexene Alberts, MD  Assistants:  None  Anesthesia:  General  Complications:  None  EBL:  Minimal  Specimens: 1. Prostate chips ID Type Source Tests Collected by Time Destination  1 : prostate chips Tissue PATH GU resection / TURBT / partial nephrectomy SURGICAL PATHOLOGY Janith Lima, MD 02/11/2022 [phone]     Drains/Catheters: 1.  22Fr 3 way catheter with 45m water into balloon  Intraoperative findings:   Kissing lateral lobes, short prostatic length. Intraprostatic calcifications. Wide open prostatic fossa at the end of the case. Excellent hemostasis  Indication:  Theodore AMADONis a 71y.o. male with BPH with bladder outlet obstruction presenting for transurethral resection of the prostate. He had a TRUS volume of 312m UDS demonstrated equivocal flow with several unstable contracts however was able to generate a voluntary contraction and void. Preop cystoscopy demonstrated obstructing lateral lobes. After thorough discussion including all relevant risk benefits and alternatives, he presents today for a bipolar TURP.  Description of procedure: The indication, alternatives, benefits and risks were discussed with the patient and informed consent was obtained.  Patient was brought to the operating room table, positioned supine, secured with a safety strap.  Pneumatic compression devices were placed on the lower extremities.  After the administration of intravenous antibiotics and general anesthesia, the patient was repositioned into the dorsal lithotomy position.  All pressure points were carefully padded.  A rectal examination was performed confirming a smooth symmetric enlarged gland.  The genitalia were prepped and draped in standard sterile manner.  A timeout was  completed, verifying the correct patient, surgical procedure and positioning prior to beginning the procedure.  Isotonic sodium chloride was used for irrigation.  A 26 French continuous-flow resectoscope sheath with the visual obturator and a 30 degree lens was advanced under direct vision into the bladder.  The anterior urethra appeared normal in its entirety.The prostatic urethra was short with bilobar hyperplasia.  On cystoscopic evaluation, his bladder capacity appeared chronically distended, the bladder wall was noted to expand symmetrically in all dimensions.  There were no tumors, stones or foreign bodies present. He did have inflammation on the posterior wall consistent with chronic indwelling foley. The bladder was trabeculated with normal-appearing mucosa.  Both ureteral orifices were in their normal anatomic positions with clear urinary reflux noted bilaterally.  The obturator was removed and replaced by the working element with a resection loop.  The location of the ureteral orifices and the prostatic configuration were again confirmed.  Starting at the bladder neck and proceeding distally to the verumontanum a transurethral section of the prostate was performed using bipolar using energy of 4 and 5 for cutting and coagulation, respectively.  The procedure began at the bladder neck at the 5 o'clock and 7 o'clock positions and carefully carried distally to the verumontanum, resecting the intervening prostatic adenoma.  Next the left lateral lobe was resected to the level of the transverse capsular fibers.  The identical procedure was performed on the right lobe.  Attention was then directed anteriorly and the resection was completed from the 10 o'clock to 2 o'clock positions.  All bleeding vessels were fulgurated achieving meticulous hemostasis.  The bladder was irrigated with a Toomey syringe, ensuring removal of all prostate chips which were sent to pathology for evaluation.  Having completed  the resection and the chips removed, we again confirmed hemostasis with the loop with coagulating current.  Upon completion of the entire procedure, the bladder and posterior urethra were reexamined, confirming open prostatic urethra and bladder neck without evidence of bleeding or perforation.  Both ureteral orifices and the external sphincter were noted to be intact.  The resectoscope was withdrawn under direct vision and a 22 French three-way Foley catheter with a 30 cc balloon was inserted into the bladder.  The balloon was inflated with 30 cc of sterile water.  After multiple manual irrigations ensuring clear return of the irrigant, the procedure was terminated.  The catheter was attached to a drainage bag and continuous bladder irrigation was started with normal saline.  The patient was positioned supine.  At the end of the procedure, all counts were correct.  Patient tolerated the procedure well and was taken to the recovery room satisfactory condition.  Plan: Continuous bladder irrigation overnight. Plan to discharge home tomorrow with Foley catheter in place and void trial in the office in 3 days.  [facility]  Pager: [phone]

## 2022-02-11 NOTE — Transfer of Care (Signed)
Immediate Anesthesia Transfer of Care Note  Patient: Theodore Miller  Procedure(s) Performed: TRANSURETHRAL RESECTION OF THE PROSTATE (TURP) (Prostate)  Patient Location: PACU  Anesthesia Type:General  Level of Consciousness: awake, alert , oriented and patient cooperative  Airway & Oxygen Therapy: Patient Spontanous Breathing  Post-op Assessment: Report given to RN and Post -op Vital signs reviewed and stable  Post vital signs: Reviewed and stable  Last Vitals:  Vitals Value Taken Time  BP 125/72 02/11/22 0836  Temp    Pulse 55 02/11/22 0838  Resp 15 02/11/22 0838  SpO2 97 % 02/11/22 0838  Vitals shown include unvalidated device data.  Last Pain:  Vitals:   02/11/22 0548  TempSrc: Oral         Complications: No notable events documented.

## 2022-02-12 DIAGNOSIS — N401 Enlarged prostate with lower urinary tract symptoms: Secondary | ICD-10-CM | POA: Diagnosis not present

## 2022-02-12 LAB — BASIC METABOLIC PANEL
Anion gap: 4 — ABNORMAL LOW (ref 5–15)
BUN: 21 mg/dL (ref 8–23)
CO2: 24 mmol/L (ref 22–32)
Calcium: 8.5 mg/dL — ABNORMAL LOW (ref 8.9–10.3)
Chloride: 112 mmol/L — ABNORMAL HIGH (ref 98–111)
Creatinine, Ser: 1.35 mg/dL — ABNORMAL HIGH (ref 0.61–1.24)
GFR, Estimated: 57 mL/min — ABNORMAL LOW (ref >60)
Glucose, Bld: 99 mg/dL (ref 70–99)
Potassium: 4 mmol/L (ref 3.5–5.1)
Sodium: 140 mmol/L (ref 135–145)

## 2022-02-12 LAB — CBC
HCT: 32.5 % — ABNORMAL LOW (ref 39.0–52.0)
Hemoglobin: 10.4 g/dL — ABNORMAL LOW (ref 13.0–17.0)
MCH: 33.2 pg (ref 26.0–34.0)
MCHC: 32 g/dL (ref 30.0–36.0)
MCV: 103.8 fL — ABNORMAL HIGH (ref 80.0–100.0)
Platelets: 111 10*3/uL — ABNORMAL LOW (ref 150–400)
RBC: 3.13 MIL/uL — ABNORMAL LOW (ref 4.22–5.81)
RDW: 13 % (ref 11.5–15.5)
WBC: 6 10*3/uL (ref 4.0–10.5)
nRBC: 0 % (ref 0.0–0.2)

## 2022-02-12 MED ORDER — GABAPENTIN 300 MG PO CAPS
ORAL_CAPSULE | ORAL | Status: AC
  Filled 2022-02-12: qty 1

## 2022-02-12 MED ORDER — OXYCODONE HCL 5 MG PO TABS
ORAL_TABLET | ORAL | Status: AC
  Filled 2022-02-12: qty 2

## 2022-02-12 NOTE — Discharge Summary (Signed)
[facility] Discharge Summary  Admit date: 02/11/2022  Discharge date and time: 02/12/22   Discharge to: Home  Discharge Service: Urology  Discharge Attending Physician:  Dr. Ellison Hughs, MD  Discharge  Diagnoses: BPH (benign prostatic hyperplasia)  Secondary Diagnosis: Principal Problem:   BPH (benign prostatic hyperplasia)   OR Procedures: Procedure(s): TRANSURETHRAL RESECTION OF THE PROSTATE (TURP) 02/11/2022   Ancillary Procedures: None   Discharge Day Services: The patient was seen and examined by the Urology team both in the morning and immediately prior to discharge.  Vital signs and laboratory values were stable and within normal limits.  The physical exam was benign and unchanged and all surgical wounds were examined.  Discharge instructions were explained and all questions answered.  Subjective  No acute events overnight. Pain Controlled. No fever or chills.  Objective Patient Vitals for the past 8 hrs:  BP Temp Pulse Resp SpO2  02/12/22 0822 (!) 117/55 97.7 F (36.5 C) (!) 42 18 95 %  02/12/22 0615 125/62 97.9 F (36.6 C) (!) 40 16 94 %  02/12/22 0226 119/64 97.9 F (36.6 C) (!) 42 16 98 %   No intake/output data recorded.  General Appearance:        No acute distress Lungs:                       Normal work of breathing on room air Heart:                                Regular rate Extremities:                      Warm and well perfused GU:         Foley in place, draining pink urine   Hospital Course:  The patient underwent TURP on 02/11/2022.  The patient tolerated the procedure well, was extubated in the OR, and afterwards was taken to the PACU for routine post-surgical care. When stable the patient was transferred to the floor.   The patient did well postoperatively.  The patient's diet was slowly advanced and at the time of discharge was tolerating a regular diet.  The patient was discharged home 1 Day Post-Op, at which point was tolerating a  regular solid diet, was able to make adequate urine, have adequate pain control with P.O. pain medication, and could ambulate without difficulty. The patient will follow up with [place] next week for trial of void.  Condition at Discharge: Improved  Discharge Medications:  Allergies as of 02/12/2022   No Known Allergies

## 2022-02-14 ENCOUNTER — Encounter (HOSPITAL_BASED_OUTPATIENT_CLINIC_OR_DEPARTMENT_OTHER): Payer: Self-pay | Admitting: Urology

## 2022-02-15 LAB — SURGICAL PATHOLOGY

## 2022-04-07 ENCOUNTER — Other Ambulatory Visit: Payer: Self-pay

## 2022-04-07 ENCOUNTER — Other Ambulatory Visit: Payer: Self-pay | Admitting: Urology

## 2022-04-07 ENCOUNTER — Encounter (HOSPITAL_COMMUNITY): Payer: Self-pay | Admitting: Urology

## 2022-04-07 NOTE — Progress Notes (Addendum)
For Short Stay: [place] appointment date: N/A  Bowel Prep reminder:N/A   For Anesthesia: PCP - Jefm Petty, MD  Cardiologist - N/A  Chest x-ray - greater than 1 year EKG - 08/01/21 in [facility] Stress Test - greater than 2 years ECHO - N/A Cardiac Cath - N/A Pacemaker/ICD device last checked: N/A Pacemaker orders received: N/A Device Rep notified: N/A  Spinal Cord Stimulator: N/A  Sleep Study - Yes CPAP - not wearing currently  Fasting Blood Sugar - N/A Checks Blood Sugar __N/A___ times a day Date and result of last Hgb A1c-N/A  Last dose of GLP1 agonist- N/A GLP1 instructions: N/A  Last dose of SGLT-2 inhibitors- N/A SGLT-2 instructions:N/A  Blood Thinner Instructions:N/A Aspirin Instructions:N/A Last Dose:N/A  Activity level: Can go up a flight of stairs and activities of daily living without stopping and without chest pain and/or shortness of breath    Anesthesia review: Abnormal R-wave progression EKG, severe OSA does not use CPAP  Patient denies shortness of breath, fever, cough and chest pain at PAT appointment   Patient verbalized understanding of instructions reviewed via telephone.

## 2022-04-11 ENCOUNTER — Ambulatory Visit (HOSPITAL_COMMUNITY)
Admission: RE | Admit: 2022-04-11 | Discharge: 2022-04-11 | Disposition: A | Discharge status: Home / Self Care | Payer: Medicare Other | Source: Ambulatory Visit | Attending: Urology | Admitting: Urology

## 2022-04-11 ENCOUNTER — Encounter (HOSPITAL_COMMUNITY)
Admission: RE | Disposition: A | Discharge status: Home / Self Care | Payer: Self-pay | Source: Ambulatory Visit | Attending: Urology

## 2022-04-11 ENCOUNTER — Ambulatory Visit (HOSPITAL_COMMUNITY): Payer: Medicare Other | Admitting: Physician Assistant

## 2022-04-11 ENCOUNTER — Ambulatory Visit (HOSPITAL_BASED_OUTPATIENT_CLINIC_OR_DEPARTMENT_OTHER): Payer: Medicare Other | Admitting: Physician Assistant

## 2022-04-11 ENCOUNTER — Encounter (HOSPITAL_COMMUNITY): Payer: Self-pay | Admitting: Urology

## 2022-04-11 DIAGNOSIS — Z87891 Personal history of nicotine dependence: Secondary | ICD-10-CM | POA: Insufficient documentation

## 2022-04-11 DIAGNOSIS — G20A1 Parkinson's disease without dyskinesia, without mention of fluctuations: Secondary | ICD-10-CM | POA: Diagnosis not present

## 2022-04-11 DIAGNOSIS — Z9884 Bariatric surgery status: Secondary | ICD-10-CM | POA: Diagnosis not present

## 2022-04-11 DIAGNOSIS — R292 Abnormal reflex: Secondary | ICD-10-CM | POA: Diagnosis present

## 2022-04-11 DIAGNOSIS — N3289 Other specified disorders of bladder: Secondary | ICD-10-CM

## 2022-04-11 DIAGNOSIS — F418 Other specified anxiety disorders: Secondary | ICD-10-CM

## 2022-04-11 DIAGNOSIS — G4733 Obstructive sleep apnea (adult) (pediatric): Secondary | ICD-10-CM | POA: Insufficient documentation

## 2022-04-11 DIAGNOSIS — N312 Flaccid neuropathic bladder, not elsewhere classified: Secondary | ICD-10-CM

## 2022-04-11 DIAGNOSIS — I10 Essential (primary) hypertension: Secondary | ICD-10-CM

## 2022-04-11 DIAGNOSIS — D649 Anemia, unspecified: Secondary | ICD-10-CM

## 2022-04-11 HISTORY — DX: Gastro-esophageal reflux disease without esophagitis: K21.9

## 2022-04-11 HISTORY — DX: Malignant neoplasm of prostate: C61

## 2022-04-11 HISTORY — DX: Cardiac murmur, unspecified: R01.1

## 2022-04-11 HISTORY — PX: INSERTION OF SUPRAPUBIC CATHETER: SHX5870

## 2022-04-11 LAB — CBC
HCT: 34 % — ABNORMAL LOW (ref 39.0–52.0)
Hemoglobin: 11.1 g/dL — ABNORMAL LOW (ref 13.0–17.0)
MCH: 34 pg (ref 26.0–34.0)
MCHC: 32.6 g/dL (ref 30.0–36.0)
MCV: 104.3 fL — ABNORMAL HIGH (ref 80.0–100.0)
Platelets: 135 10*3/uL — ABNORMAL LOW (ref 150–400)
RBC: 3.26 MIL/uL — ABNORMAL LOW (ref 4.22–5.81)
RDW: 13.6 % (ref 11.5–15.5)
WBC: 3.1 10*3/uL — ABNORMAL LOW (ref 4.0–10.5)
nRBC: 0 % (ref 0.0–0.2)

## 2022-04-11 LAB — BASIC METABOLIC PANEL
Anion gap: 4 — ABNORMAL LOW (ref 5–15)
BUN: 23 mg/dL (ref 8–23)
CO2: 24 mmol/L (ref 22–32)
Calcium: 8.6 mg/dL — ABNORMAL LOW (ref 8.9–10.3)
Chloride: 111 mmol/L (ref 98–111)
Creatinine, Ser: 1.38 mg/dL — ABNORMAL HIGH (ref 0.61–1.24)
GFR, Estimated: 55 mL/min — ABNORMAL LOW (ref >60)
Glucose, Bld: 96 mg/dL (ref 70–99)
Potassium: 3.9 mmol/L (ref 3.5–5.1)
Sodium: 139 mmol/L (ref 135–145)

## 2022-04-11 SURGERY — INSERTION, SUPRAPUBIC CATHETER
Anesthesia: General | Site: Abdomen

## 2022-04-11 MED ORDER — OXYCODONE HCL 5 MG/5ML PO SOLN: 5.0000 mg | Freq: Once | ORAL | Status: DC | PRN

## 2022-04-11 MED ORDER — LIDOCAINE HCL (PF) 1 % IJ SOLN
INTRAMUSCULAR | Status: DC | PRN
  Administered 2022-04-11: 10 mL

## 2022-04-11 MED ORDER — DEXAMETHASONE SODIUM PHOSPHATE 10 MG/ML IJ SOLN
INTRAMUSCULAR | Status: AC
  Filled 2022-04-11: qty 1

## 2022-04-11 MED ORDER — CHLORHEXIDINE GLUCONATE 0.12 % MT SOLN
15.0000 mL | Freq: Once | OROMUCOSAL | Status: AC
  Administered 2022-04-11: 15 mL via OROMUCOSAL

## 2022-04-11 MED ORDER — PROPOFOL 10 MG/ML IV BOLUS
INTRAVENOUS | Status: DC | PRN
  Administered 2022-04-11: 40 mg via INTRAVENOUS
  Administered 2022-04-11: 120 mg via INTRAVENOUS

## 2022-04-11 MED ORDER — FENTANYL CITRATE PF 50 MCG/ML IJ SOSY: 25.0000 ug | PREFILLED_SYRINGE | INTRAMUSCULAR | Status: DC | PRN

## 2022-04-11 MED ORDER — ACETAMINOPHEN 160 MG/5ML PO SOLN: 325.0000 mg | ORAL | Status: DC | PRN

## 2022-04-11 MED ORDER — ACETAMINOPHEN 325 MG PO TABS: 325.0000 mg | ORAL_TABLET | ORAL | Status: DC | PRN

## 2022-04-11 MED ORDER — OXYCODONE HCL 5 MG PO TABS: 5.0000 mg | ORAL_TABLET | Freq: Once | ORAL | Status: DC | PRN

## 2022-04-11 MED ORDER — PROPOFOL 10 MG/ML IV BOLUS
INTRAVENOUS | Status: AC
  Filled 2022-04-11: qty 20

## 2022-04-11 MED ORDER — DEXAMETHASONE SODIUM PHOSPHATE 10 MG/ML IJ SOLN
INTRAMUSCULAR | Status: DC | PRN
  Administered 2022-04-11: 4 mg via INTRAVENOUS

## 2022-04-11 MED ORDER — MIDAZOLAM HCL 2 MG/2ML IJ SOLN
INTRAMUSCULAR | Status: DC | PRN
  Administered 2022-04-11 (×2): 1 mg via INTRAVENOUS

## 2022-04-11 MED ORDER — LIDOCAINE HCL (PF) 2 % IJ SOLN
INTRAMUSCULAR | Status: AC
  Filled 2022-04-11: qty 5

## 2022-04-11 MED ORDER — ONDANSETRON HCL 4 MG/2ML IJ SOLN: 4.0000 mg | Freq: Once | INTRAMUSCULAR | Status: DC | PRN

## 2022-04-11 MED ORDER — ACETAMINOPHEN 500 MG PO TABS
1000.0000 mg | ORAL_TABLET | Freq: Once | ORAL | Status: AC
  Administered 2022-04-11: 1000 mg via ORAL
  Filled 2022-04-11: qty 2

## 2022-04-11 MED ORDER — LACTATED RINGERS IV SOLN: INTRAVENOUS | Status: DC

## 2022-04-11 MED ORDER — SODIUM CHLORIDE 0.9 % IR SOLN
Status: DC | PRN
  Administered 2022-04-11: 6000 mL

## 2022-04-11 MED ORDER — ONDANSETRON HCL 4 MG/2ML IJ SOLN
INTRAMUSCULAR | Status: DC | PRN
  Administered 2022-04-11: 4 mg via INTRAVENOUS

## 2022-04-11 MED ORDER — LIDOCAINE HCL (PF) 1 % IJ SOLN
INTRAMUSCULAR | Status: AC
  Filled 2022-04-11: qty 30

## 2022-04-11 MED ORDER — ONDANSETRON HCL 4 MG/2ML IJ SOLN
INTRAMUSCULAR | Status: AC
  Filled 2022-04-11: qty 2

## 2022-04-11 MED ORDER — FENTANYL CITRATE (PF) 100 MCG/2ML IJ SOLN
INTRAMUSCULAR | Status: DC | PRN
  Administered 2022-04-11: 25 ug via INTRAVENOUS

## 2022-04-11 MED ORDER — STERILE WATER FOR IRRIGATION IR SOLN
Status: DC | PRN
  Administered 2022-04-11: 1000 mL

## 2022-04-11 MED ORDER — ORAL CARE MOUTH RINSE: 15.0000 mL | Freq: Once | OROMUCOSAL | Status: AC

## 2022-04-11 MED ORDER — MEPERIDINE HCL 50 MG/ML IJ SOLN: 6.2500 mg | INTRAMUSCULAR | Status: DC | PRN

## 2022-04-11 MED ORDER — FENTANYL CITRATE (PF) 100 MCG/2ML IJ SOLN
INTRAMUSCULAR | Status: AC
  Filled 2022-04-11: qty 2

## 2022-04-11 MED ORDER — LIDOCAINE 2% (20 MG/ML) 5 ML SYRINGE
INTRAMUSCULAR | Status: DC | PRN
  Administered 2022-04-11: 40 mg via INTRAVENOUS

## 2022-04-11 MED ORDER — MIDAZOLAM HCL 2 MG/2ML IJ SOLN
INTRAMUSCULAR | Status: AC
  Filled 2022-04-11: qty 2

## 2022-04-11 MED ORDER — CEFAZOLIN SODIUM-DEXTROSE 2-4 GM/100ML-% IV SOLN
2.0000 g | INTRAVENOUS | Status: AC
  Administered 2022-04-11: 2 g via INTRAVENOUS
  Filled 2022-04-11: qty 100

## 2022-04-11 MED ORDER — EPHEDRINE SULFATE-NACL 50-0.9 MG/10ML-% IV SOSY
PREFILLED_SYRINGE | INTRAVENOUS | Status: DC | PRN
  Administered 2022-04-11 (×4): 10 mg via INTRAVENOUS

## 2022-04-11 MED ORDER — GLYCOPYRROLATE 0.2 MG/ML IJ SOLN
INTRAMUSCULAR | Status: DC | PRN
  Administered 2022-04-11: .2 mg via INTRAVENOUS

## 2022-04-11 SURGICAL SUPPLY — 35 items
BAG COUNTER SPONGE SURGICOUNT (BAG) IMPLANT
BAG URINE DRAIN 2000ML AR STRL (UROLOGICAL SUPPLIES) IMPLANT
BAG URO CATCHER STRL LF (MISCELLANEOUS) ×1 IMPLANT
BLADE CLIPPER SURG (BLADE) IMPLANT
CATH BONANNO SUPRAPUBIC 14G (CATHETERS) IMPLANT
CATH FOLEY 2WAY SLVR  5CC 16FR (CATHETERS) ×1
CATH FOLEY 2WAY SLVR  5CC 18FR (CATHETERS)
CATH FOLEY 2WAY SLVR 5CC 16FR (CATHETERS) IMPLANT
CATH FOLEY 2WAY SLVR 5CC 18FR (CATHETERS) IMPLANT
CATH FOLEY INTRO SUPRA 16F (CATHETERS) IMPLANT
COVER SURGICAL LIGHT HANDLE (MISCELLANEOUS) ×1 IMPLANT
ELECT REM PT RETURN 15FT ADLT (MISCELLANEOUS) IMPLANT
GAUZE SPONGE 4X4 12PLY STRL (GAUZE/BANDAGES/DRESSINGS) IMPLANT
GLOVE BIO SURGEON STRL SZ8 (GLOVE) ×1 IMPLANT
GOWN STRL REUS W/ TWL XL LVL3 (GOWN DISPOSABLE) ×1 IMPLANT
GOWN STRL REUS W/TWL XL LVL3 (GOWN DISPOSABLE) ×1
HOLDER FOLEY CATH W/STRAP (MISCELLANEOUS) IMPLANT
IV NS IRRIG 3000ML ARTHROMATIC (IV SOLUTION) IMPLANT
KIT TURNOVER KIT A (KITS) IMPLANT
NDL HYPO 25X1 1.5 SAFETY (NEEDLE) ×1 IMPLANT
NDL SPNL 22GX3.5 QUINCKE BK (NEEDLE) IMPLANT
NEEDLE HYPO 25X1 1.5 SAFETY (NEEDLE) ×1 IMPLANT
NEEDLE SPNL 22GX3.5 QUINCKE BK (NEEDLE) ×1 IMPLANT
PACK CYSTO (CUSTOM PROCEDURE TRAY) ×1 IMPLANT
PENCIL SMOKE EVACUATOR (MISCELLANEOUS) IMPLANT
SUT SILK 0 (SUTURE) ×1
SUT SILK 0 30XBRD TIE 6 (SUTURE) ×1 IMPLANT
SUT SILK 2 0 PERMA HAND 18 BK (SUTURE) IMPLANT
SUT VIC AB 3-0 SH 27 (SUTURE) ×1
SUT VIC AB 3-0 SH 27X BRD (SUTURE) IMPLANT
SYR 10ML LL (SYRINGE) IMPLANT
SYR TOOMEY IRRIG 70ML (MISCELLANEOUS)
SYRINGE TOOMEY IRRIG 70ML (MISCELLANEOUS) IMPLANT
TAPE CLOTH SURG 4X10 WHT LF (GAUZE/BANDAGES/DRESSINGS) IMPLANT
WATER STERILE IRR 3000ML UROMA (IV SOLUTION) ×1 IMPLANT

## 2022-04-11 NOTE — Discharge Instructions (Signed)
Activity:  You are encouraged to ambulate frequently (about every hour during waking hours) to help prevent blood clots from forming in your legs or lungs.    Diet: You should advance your diet as instructed by your physician.  It will be normal to have some bloating, nausea, and abdominal discomfort intermittently.  Prescriptions:  You will be provided a prescription for pain medication to take as needed.  If your pain is not severe enough to require the prescription pain medication, you may take extra strength Tylenol instead which will have less side effects.  You should also take a prescribed stool softener to avoid straining with bowel movements as the prescription pain medication may constipate you.  What to call us about: You should call the office [phone]) if you develop fever > 101 or develop persistent vomiting. Activity:  You are encouraged to ambulate frequently (about every hour during waking hours) to help prevent blood clots from forming in your legs or lungs.    You have a suprapubic catheter in place. Return to clinic in 30 days for exchange.

## 2022-04-11 NOTE — Transfer of Care (Signed)
Immediate Anesthesia Transfer of Care Note  Patient: Theodore Miller  Procedure(s) Performed: SUPRAPUBIC TUBE PLACEMENT (Abdomen)  Patient Location: PACU  Anesthesia Type:General  Level of Consciousness: awake, drowsy, and patient cooperative  Airway & Oxygen Therapy: Patient Spontanous Breathing and Patient connected to face mask oxygen  Post-op Assessment: Report given to RN and Post -op Vital signs reviewed and stable  Post vital signs: Reviewed and stable  Last Vitals:  Vitals Value Taken Time  BP 160/64 04/11/22 1313  Temp    Pulse 47 04/11/22 1315  Resp 12 04/11/22 1315  SpO2 100 % 04/11/22 1315  Vitals shown include unvalidated device data.  Last Pain:  Vitals:   04/11/22 1112  TempSrc:   PainSc: 4       Patients Stated Pain Goal: 3 (22/63/33 [id number])  Complications: No notable events documented.

## 2022-04-11 NOTE — Op Note (Signed)
Operative Note  Preoperative diagnosis:  1.  Areflexic bladder  Postoperative diagnosis: 1.  Areflexic bladder  Procedure(s): 1.  Cystoscopy 2. Open suprapubic catheter placement  Surgeon: Rexene Alberts, MD  Assistants:  None  Anesthesia:  General  Complications:  None  EBL:  Minimal  Specimens: 1. None  Drains/Catheters: 1.  16 Fr suprapubic catheter  Intraoperative findings:   Cystoscopy demonstrated wide open, right nonobstructing prostatic urethra.  He has severe bladder trabeculation with large capacity bladder accommodating over 1.5 L. Successful placement of 16 Fr suprapubic catheter without difficulty.  Indication:  Theodore Miller is a 71 y.o. male with history of areflexic bladder who is undergoing suprapubic tube placement today.  Description of procedure: After informed consent was obtained from the patient, the patient was identified and taken to the operating room and placed in the supine position.  General anesthesia was administered as well as perioperative IV antibiotics.  At the beginning of the case, a time-out was performed to properly identify the patient, the surgery to be performed, and the surgical site.  Sequential compression devices were applied to the lower extremities at the beginning of the case for DVT prophylaxis.  The patient was then placed in the dorsal lithotomy supine position, prepped and draped in sterile fashion.  We passed the 21-French rigid cystoscope through the urethra and into the bladder under vision without any difficulty , noting a normal urethra without strictures and nonobstructing prostate.  A systematic evaluation of the bladder revealed no evidence of any suspicious bladder lesions.  Ureteral orifices were in normal position.    The patient was placed into Trendelenburg position.  After placing the patient into Trendelenburg, we located his pubic symphysis.  Next, a 1 cm vertical incision was performed 2 fingerbreadths superior  to the pubic symphysis.  We then introduced and spread the subcutaneous tissues using a Kelly. The fascia was opened with a 15 blade.  We then introduced a spinal needle into the bladder and noted this was introducing into the midline on the dome of the bladder with good urine flow out of the needle.  We then removed the spinal needle and then introduced the trocar into the bladder. A 16-French Foley catheter was placed through the trocar into the bladder. We then injected 10 mL of sterile water into the balloon.  The Foley was sutured in place using 0 silk sutures. We applied gauze dressing and tape over the site. The patient tolerated the procedure well and there was no complication. Patient was awoken from anesthesia and taken to the recovery room in stable condition. I was present and scrubbed for the entirety of the case.  Plan:  Discharge home. F/u in the office as scheduled. He will have his first SPT exchange performed in the office in 1 month.  [facility]  Pager: [phone]

## 2022-04-11 NOTE — Anesthesia Procedure Notes (Signed)
Procedure Name: LMA Insertion Date/Time: 04/11/2022 12:27 PM  Performed by: Eben Burow, CRNAPre-anesthesia Checklist: Patient identified, Emergency Drugs available, Suction available, Patient being monitored and Timeout performed Patient Re-evaluated:Patient Re-evaluated prior to induction Oxygen Delivery Method: Circle system utilized Preoxygenation: Pre-oxygenation with 100% oxygen Induction Type: IV induction Ventilation: Mask ventilation without difficulty LMA: LMA inserted LMA Size: 4.0 Tube secured with: Tape Dental Injury: Teeth and Oropharynx as per pre-operative assessment

## 2022-04-11 NOTE — Anesthesia Preprocedure Evaluation (Addendum)
Anesthesia Evaluation  Patient identified by MRN, date of birth, ID band Patient awake    Reviewed: Allergy & Precautions, NPO status , Patient's Chart, lab work & pertinent test results  History of Anesthesia Complications Negative for: history of anesthetic complications  Airway Mallampati: II  TM Distance: >3 FB Neck ROM: Limited    Dental  (+) Dental Advisory Given, Teeth Intact   Pulmonary sleep apnea , former smoker severe osa w/ AHI 60.5/ hr, used bipap but stopped, pt has been referred to ENT for possible INSPIRE; uses several pillows when sleeping   Pulmonary exam normal        Cardiovascular hypertension, Pt. on medications Normal cardiovascular exam  (stress echo 03-02-2019 in care everywhere normal LVF and wall motion,  no sig ST-T changes   Neuro/Psych   Anxiety Depression    Parkinson's disease  Neuromuscular disease    GI/Hepatic Neg liver ROS, PUD,,,S/p gastric bypass   Endo/Other  negative endocrine ROS    Renal/GU negative Renal ROS  negative genitourinary   Musculoskeletal negative musculoskeletal ROS (+)    Abdominal   Peds  Hematology negative hematology ROS (+)   Anesthesia Other Findings   Reproductive/Obstetrics                             Anesthesia Physical Anesthesia Plan  ASA: 3  Anesthesia Plan: General   Post-op Pain Management: Tylenol PO (pre-op)*   Induction: Intravenous  PONV Risk Score and Plan: 2 and Ondansetron, Dexamethasone, Treatment may vary due to age or medical condition and Propofol infusion  Airway Management Planned: LMA  Additional Equipment: None  Intra-op Plan:   Post-operative Plan: Extubation in OR  Informed Consent: I have reviewed the patients History and Physical, chart, labs and discussed the procedure including the risks, benefits and alternatives for the proposed anesthesia with the patient or authorized  representative who has indicated his/her understanding and acceptance.     Dental advisory given  Plan Discussed with: Anesthesiologist and CRNA  Anesthesia Plan Comments: ( )        Anesthesia Quick Evaluation

## 2022-04-11 NOTE — Anesthesia Postprocedure Evaluation (Signed)
Anesthesia Post Note  Patient: Theodore Miller  Procedure(s) Performed: SUPRAPUBIC TUBE PLACEMENT (Abdomen)     Patient location during evaluation: PACU Anesthesia Type: General Level of consciousness: awake and alert Pain management: pain level controlled Vital Signs Assessment: post-procedure vital signs reviewed and stable Respiratory status: spontaneous breathing, nonlabored ventilation, respiratory function stable and patient connected to nasal cannula oxygen Cardiovascular status: blood pressure returned to baseline and stable Postop Assessment: no apparent nausea or vomiting Anesthetic complications: no   No notable events documented.  Last Vitals:  Vitals:   04/11/22 1330 04/11/22 1345  BP: (!) 166/59 126/65  Pulse: (!) 46 66  Resp: 14 15  Temp:  36.6 C  SpO2: 100% 98%    Last Pain:  Vitals:   04/11/22 1345  TempSrc:   PainSc: 0-No pain                 Kiah Vanalstine

## 2022-04-11 NOTE — H&P (Signed)
Urology Preoperative H&P   Chief Complaint: Areflexic bladder  History of Present Illness: Theodore Miller is a 71 y.o. male with areflexic bladder here for SPT. Denies fevers, chills. Tolerating foley.    Past Medical History:  Diagnosis Date   B12 deficiency    Benign essential tremor    Benign localized prostatic hyperplasia with lower urinary tract symptoms (LUTS)    urologist--- dr Margery Szostak   Chronic pain syndrome    neck and back   Foley catheter in place    GAD (generalized anxiety disorder)    GERD (gastroesophageal reflux disease)    Heart murmur    History of gastric ulcer 02/2016   History of iron deficiency anemia    History of syncope    happened 01-15-2022 but did not go to ED ,  follow up w/ pcp 01-19-2022 note in epic ,  stated happeden when he stood up got dizziness / passed out;   told by pcp orthostatic hypotension with dehydration, stated lisinopril on hold for 2 wks , stated pt was not referred to cardiology   History of thrombocytopenia    Hyperlipidemia    Hypertension    followed by pcp    (stress echo 03-02-2019 in care everywhere normal LVF and wall motion,  no sig ST-T changes   MDD (major depressive disorder)    Neuropathy    OSA (obstructive sleep apnea)    followed by neuology--- retested 06-12-2020 in epic , severe osa w/ AHI 60.5/ hr,  used bipap but stopped,  pt has been referred to ENT for possible INSPIRE;  uses several pillows when sleeping   Parkinson disease    neurologist--- hope scales NP ([facility] in [place])   Postoperative malabsorption    Prostate cancer ([place])    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    Past Surgical History:  Procedure Laterality Date   CATARACT EXTRACTION W/ INTRAOCULAR LENS IMPLANT Bilateral 2018   CHOLECYSTECTOMY, LAPAROSCOPIC  07/24/2002   '@[facility]'$  by dr Rise Patience   ESOPHAGOGASTRODUODENOSCOPY  05/2016   LUMBAR Aspers   age 27   ORIF CLAVICLE FRACTURE Left    52s  ( age 53s ,per pt no retained hardware)    ROUX-EN-Y GASTRIC BYPASS  12/03/2001   '@[facility]'$  by dr Patsey Berthold   TONSILLECTOMY     teen   TRANSURETHRAL RESECTION OF PROSTATE N/A 02/11/2022   Procedure: TRANSURETHRAL RESECTION OF THE PROSTATE (TURP);  Surgeon: Janith Lima, MD;  Location: [facility];  Service: Urology;  Laterality: N/A;    Allergies: No Known Allergies  History reviewed. No pertinent family history.  Social History:  reports that he quit smoking about 30 years ago. His smoking use included cigarettes. He has a 30.00 pack-year smoking history. He has never been exposed to tobacco smoke. He has never used smokeless tobacco. He reports that he does not drink alcohol and does not use drugs.  ROS: A complete review of systems was performed.  All systems are negative except for pertinent findings as noted.  Physical Exam:  Vital signs in last 24 hours: Temp:  [97.6 F (36.4 C)] 97.6 F (36.4 C) (11/06 1046) Pulse Rate:  [40] 40 (11/06 1046) Resp:  [14] 14 (11/06 1046) BP: (145)/(61) 145/61 (11/06 1046) SpO2:  [100 %] 100 % (11/06 1046) Weight:  [79.4 kg] 79.4 kg (11/06 1112) Constitutional:  Alert and oriented, No acute distress Cardiovascular: Regular rate and rhythm Respiratory: Normal respiratory effort, Lungs clear bilaterally GI: Abdomen  is soft, nontender, nondistended, no abdominal masses GU: No CVA tenderness Lymphatic: No lymphadenopathy Neurologic: Grossly intact, no focal deficits Psychiatric: Normal mood and affect  Laboratory Data:  No results for input(s): "WBC", "HGB", "HCT", "PLT" in the last 72 hours.  No results for input(s): "NA", "K", "CL", "GLUCOSE", "BUN", "CALCIUM", "CREATININE" in the last 72 hours.  Invalid input(s): "CO3"   No results found for this or any previous visit (from the past 24 hour(s)). No results found for this or any previous visit (from the past 240 hour(s)).  Renal Function: No results for input(s): "CREATININE" in the last 168 hours. CrCl cannot  be calculated (Patient's most recent lab result is older than the maximum 21 days allowed.).  Radiologic Imaging: No results found.  I independently reviewed the above imaging studies.  Assessment and Plan Theodore Miller is a 71 y.o. male with areflexic bladder here for suprapubic tube. Denies fevers, chills. Tolerating foley.   Discussed risks of injury, bleeding, infection. Ok to proceed.    Matt R. Kaydi Kley MD 04/11/2022, 12:03 PM  [facility] Pager: [phone]): [phone]

## 2022-04-12 ENCOUNTER — Encounter (HOSPITAL_COMMUNITY): Payer: Self-pay | Admitting: Urology

## 2022-07-11 ENCOUNTER — Encounter: Payer: Self-pay | Admitting: Psychiatry

## 2022-07-11 ENCOUNTER — Ambulatory Visit (INDEPENDENT_AMBULATORY_CARE_PROVIDER_SITE_OTHER): Payer: Medicare Other | Admitting: Psychiatry

## 2022-07-11 DIAGNOSIS — G4733 Obstructive sleep apnea (adult) (pediatric): Secondary | ICD-10-CM | POA: Diagnosis not present

## 2022-07-11 DIAGNOSIS — F3341 Major depressive disorder, recurrent, in partial remission: Secondary | ICD-10-CM

## 2022-07-11 MED ORDER — ARMODAFINIL 250 MG PO TABS: 250.0000 mg | ORAL_TABLET | Freq: Every day | ORAL | 1 refills | Status: DC

## 2022-07-11 MED ORDER — FLUOXETINE HCL 20 MG PO CAPS: 60.0000 mg | ORAL_CAPSULE | Freq: Every day | ORAL | 3 refills | Status: DC

## 2022-07-11 MED ORDER — BUPROPION HCL ER (XL) 150 MG PO TB24: ORAL_TABLET | ORAL | 3 refills | Status: DC

## 2022-07-11 NOTE — Progress Notes (Signed)
Theodore Miller [id number] 1950-10-20 72 y.o.  Subjective:   Patient ID:  Theodore Miller is a 72 y.o. (DOB 1950/10/14) male.  Chief Complaint:  Chief Complaint  Patient presents with   Follow-up    Depression, anxiety    HPI Theodore Miller presents to the office today for follow-up of depression and anxiety. He denies any concerns or significant changes since last visit. Denies any significant depression. He reports that he rarely takes Alprazolam prn. Some occ increased anxiety with holidays and having people over.   He had implant for sleep apnea placed and it will be turned on tomorrow. He reports "rough sleep" and awakening every hour. Appetite has increased over the last few months and will sometimes eat during the night. Energy has been low with sleep disturbance. Motivation is there- "I want to do things, I just don't get them done." He reports that his concentration has been poor and having difficulty following in instructions. He reports some word finding difficulties. Denies SI.   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   Armodafinil last filled 05/07/22.   Past Psychiatric Medication Trials: Prozac- Taken since 2009. Denies any change in response. Lexapro- anxiety Cymbalta Wellbutrin Nuvigil Xanax Lamictal Depakote ER Rozerem Gabapentin- Prescribed for neuropathy Abilify- Reportedly more irritability   Flowsheet Row Admission (Discharged) from 02/11/2022 in [place] ED from 12/24/2021 in [facility] at [facility] from 08/01/2021 in [facility] Department at [place] No Risk No Risk No Risk        Review of Systems:  Review of Systems  Musculoskeletal:  Positive for back pain, neck pain and neck stiffness. Negative for gait problem.  Neurological:  Positive for tremors.  Psychiatric/Behavioral:         Please refer to HPI    Medications: I  have reviewed the patient's current medications.  Current Outpatient Medications  Medication Sig Dispense Refill   carbidopa-levodopa (SINEMET IR) 10-100 MG tablet Take 1 tablet by mouth 3 (three) times daily.     [START ON 07/30/2022] Armodafinil 250 MG tablet Take 1 tablet (250 mg total) by mouth daily. 90 tablet 1   buPROPion (WELLBUTRIN XL) 150 MG 24 hr tablet TAKE 2 TO 3 TABLETS BY MOUTH  DAILY 270 tablet 3   docusate sodium (COLACE) 100 MG capsule Take 100 mg by mouth at bedtime.     Esomeprazole Magnesium 20 MG TBEC Take 40 mg by mouth daily.     FLUoxetine (PROZAC) 20 MG capsule Take 3 capsules (60 mg total) by mouth daily. 270 capsule 3   gabapentin (NEURONTIN) 300 MG capsule Take 300 mg by mouth 3 (three) times daily.     levorphanol (LEVODROMORAN) 2 MG tablet Take 2 mg by mouth 2 (two) times daily. (Patient not taking: Reported on 04/07/2022)     lisinopril (PRINIVIL,ZESTRIL) 5 MG tablet Take 5 mg by mouth at bedtime.     Multiple Vitamins-Minerals (BARIATRIC MULTIVITAMINS/IRON) CAPS Take by mouth daily.     Oxycodone HCl 10 MG TABS Take 10 mg by mouth 5 (five) times daily.     rosuvastatin (CRESTOR) 20 MG tablet Take 20 mg by mouth every evening.     tamsulosin (FLOMAX) 0.4 MG CAPS capsule Take 0.4 mg by mouth at bedtime.     topiramate (TOPAMAX) 50 MG tablet Take 100 mg by mouth 2 (two) times daily.  vitamin E 180 MG (400 UNITS) capsule Take 400 Units by mouth daily.     No current facility-administered medications for this visit.   Facility-Administered Medications Ordered in Other Visits  Medication Dose Route Frequency Provider Last Rate Last Admin   ondansetron (ZOFRAN) 4 mg in sodium chloride 0.9 % 50 mL IVPB  4 mg Intravenous Q6H PRN Ashok Pall, MD        Medication Side Effects: None  Allergies: No Known Allergies  Past Medical History:  Diagnosis Date   B12 deficiency    Benign essential tremor    Benign localized prostatic hyperplasia with lower urinary  tract symptoms (LUTS)    urologist--- dr gay   Chronic pain syndrome    neck and back   Foley catheter in place    GAD (generalized anxiety disorder)    GERD (gastroesophageal reflux disease)    Heart murmur    History of gastric ulcer 02/2016   History of iron deficiency anemia    History of syncope    happened 01-15-2022 but did not go to ED ,  follow up w/ pcp 01-19-2022 note in epic ,  stated happeden when he stood up got dizziness / passed out;   told by pcp orthostatic hypotension with dehydration, stated lisinopril on hold for 2 wks , stated pt was not referred to cardiology   History of thrombocytopenia    Hyperlipidemia    Hypertension    followed by pcp    (stress echo 03-02-2019 in care everywhere normal LVF and wall motion,  no sig ST-T changes   MDD (major depressive disorder)    Neuropathy    OSA (obstructive sleep apnea)    followed by neuology--- retested 06-12-2020 in epic , severe osa w/ AHI 60.5/ hr,  used bipap but stopped,  pt has been referred to ENT for possible INSPIRE;  uses several pillows when sleeping   Parkinson disease    neurologist--- hope scales NP ([facility] in [place])   Postoperative malabsorption    Prostate cancer ([place])    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   Please see review of systems for further details on the patient's review from today.   Objective:   Physical Exam:  BP 134/71   Pulse (!) 54   Physical Exam Constitutional:      General: He is not in acute distress. Musculoskeletal:        General: No deformity.  Neurological:     Mental Status: He is alert and oriented to person, place, and time.     Coordination: Coordination normal.  Psychiatric:        Attention and Perception: Attention and perception normal. He does not perceive auditory or visual hallucinations.        Mood and Affect: Mood normal. Mood is  not anxious or depressed. Affect is not labile, blunt, angry or inappropriate.        Speech: Speech normal.        Behavior: Behavior normal.        Thought Content: Thought content normal. Thought content is not paranoid or delusional. Thought content does not include homicidal or suicidal ideation. Thought content does not include homicidal or suicidal plan.        Cognition and Memory: Cognition and memory normal.        Judgment: Judgment normal.     Comments: Insight  intact     Lab Review:     Component Value Date/Time   NA 139 04/11/2022 1122   K 3.9 04/11/2022 1122   CL 111 04/11/2022 1122   CO2 24 04/11/2022 1122   GLUCOSE 96 04/11/2022 1122   BUN 23 04/11/2022 1122   CREATININE 1.38 (H) 04/11/2022 1122   CALCIUM 8.6 (L) 04/11/2022 1122   PROT 7.4 08/01/2021 0615   ALBUMIN 4.3 08/01/2021 0615   AST 29 08/01/2021 0615   ALT 38 08/01/2021 0615   ALKPHOS 68 08/01/2021 0615   BILITOT 0.4 08/01/2021 0615   GFRNONAA 55 (L) 04/11/2022 1122   GFRAA  08/15/2007 0435    >60        The eGFR has been calculated using the MDRD equation. This calculation has not been validated in all clinical       Component Value Date/Time   WBC 3.1 (L) 04/11/2022 1030   RBC 3.26 (L) 04/11/2022 1030   HGB 11.1 (L) 04/11/2022 1030   HCT 34.0 (L) 04/11/2022 1030   PLT 135 (L) 04/11/2022 1030   MCV 104.3 (H) 04/11/2022 1030   MCH 34.0 04/11/2022 1030   MCHC 32.6 04/11/2022 1030   RDW 13.6 04/11/2022 1030   LYMPHSABS 1.4 08/01/2021 0615   MONOABS 0.5 08/01/2021 0615   EOSABS 0.1 08/01/2021 0615   BASOSABS 0.0 08/01/2021 0615    No results found for: "POCLITH", "LITHIUM"   Lab Results  Component Value Date   VALPROATE 65.3 08/15/2007     .res Assessment: Plan:   Will continue current plan of care since target signs and symptoms are well controlled without any tolerability issues. Continue Prozac 60 mg po qd for depression and anxiety.  Continue Wellbutrin XL 300 mg po qd for  depression.  Continue Armodafinil 250 mg po qd for sleep apnea and excessive daytime somnolence.  Pt to follow-up in 6 months or sooner if clinically indicated.  Patient advised to contact office with any questions, adverse effects, or acute worsening in signs and symptoms.   Theodore Miller was seen today for follow-up.  Diagnoses and all orders for this visit:  Recurrent major depressive disorder, in partial remission (HCC) -     buPROPion (WELLBUTRIN XL) 150 MG 24 hr tablet; TAKE 2 TO 3 TABLETS BY MOUTH  DAILY -     FLUoxetine (PROZAC) 20 MG capsule; Take 3 capsules (60 mg total) by mouth daily.  Obstructive sleep apnea syndrome -     Armodafinil 250 MG tablet; Take 1 tablet (250 mg total) by mouth daily.     Please see After Visit Summary for patient specific instructions.  Future Appointments  Date Time Provider [place]  01/09/2023 10:00 AM Thayer Headings, PMHNP CP-CP None    No orders of the defined types were placed in this encounter.   -------------------------------

## 2022-09-04 ENCOUNTER — Encounter (HOSPITAL_BASED_OUTPATIENT_CLINIC_OR_DEPARTMENT_OTHER): Payer: Self-pay | Admitting: Emergency Medicine

## 2022-09-04 ENCOUNTER — Emergency Department (HOSPITAL_BASED_OUTPATIENT_CLINIC_OR_DEPARTMENT_OTHER): Payer: Medicare Other

## 2022-09-04 ENCOUNTER — Emergency Department (HOSPITAL_BASED_OUTPATIENT_CLINIC_OR_DEPARTMENT_OTHER)
Admission: EM | Admit: 2022-09-04 | Discharge: 2022-09-04 | Disposition: A | Discharge status: Home / Self Care | Payer: Medicare Other | Attending: Emergency Medicine | Admitting: Emergency Medicine

## 2022-09-04 ENCOUNTER — Other Ambulatory Visit: Payer: Self-pay

## 2022-09-04 DIAGNOSIS — S82142A Displaced bicondylar fracture of left tibia, initial encounter for closed fracture: Secondary | ICD-10-CM

## 2022-09-04 DIAGNOSIS — G20C Parkinsonism, unspecified: Secondary | ICD-10-CM | POA: Insufficient documentation

## 2022-09-04 DIAGNOSIS — W010XXA Fall on same level from slipping, tripping and stumbling without subsequent striking against object, initial encounter: Secondary | ICD-10-CM | POA: Diagnosis not present

## 2022-09-04 DIAGNOSIS — G8929 Other chronic pain: Secondary | ICD-10-CM | POA: Insufficient documentation

## 2022-09-04 DIAGNOSIS — I10 Essential (primary) hypertension: Secondary | ICD-10-CM | POA: Insufficient documentation

## 2022-09-04 DIAGNOSIS — W19XXXA Unspecified fall, initial encounter: Secondary | ICD-10-CM

## 2022-09-04 DIAGNOSIS — Z79899 Other long term (current) drug therapy: Secondary | ICD-10-CM | POA: Diagnosis not present

## 2022-09-04 DIAGNOSIS — S80912A Unspecified superficial injury of left knee, initial encounter: Secondary | ICD-10-CM | POA: Diagnosis present

## 2022-09-04 NOTE — ED Provider Notes (Signed)
[facility] Provider Note   CSN: [id number] Arrival date & time: 09/04/22  2028     History  Chief Complaint  Patient presents with   Knee Pain    Theodore Miller is a 72 y.o. male.  The history is provided by the patient and medical records. No language interpreter was used.  Knee Pain Location:  Knee Time since incident:  3 hours Injury: yes   Mechanism of injury: fall   Fall:    Impact surface:  Dirt Knee location:  L knee Pain details:    Quality:  Aching   Radiates to:  Does not radiate   Severity:  Severe   Onset quality:  Sudden   Timing:  Constant   Progression:  Unchanged Chronicity:  Recurrent Ineffective treatments:  None tried Associated symptoms: swelling   Associated symptoms: no back pain, no fever, no itching, no muscle weakness, no neck pain, no numbness, no stiffness and no tingling        Home Medications Prior to Admission medications   Medication Sig Start Date End Date Taking? Authorizing Provider  Armodafinil 250 MG tablet Take 1 tablet (250 mg total) by mouth daily. 07/30/22   Thayer Headings, PMHNP  buPROPion (WELLBUTRIN XL) 150 MG 24 hr tablet TAKE 2 TO 3 TABLETS BY MOUTH  DAILY 07/11/22   Thayer Headings, PMHNP  carbidopa-levodopa (SINEMET IR) 10-100 MG tablet Take 1 tablet by mouth 3 (three) times daily.    [provider]  docusate sodium (COLACE) 100 MG capsule Take 100 mg by mouth at bedtime.    [provider]  Esomeprazole Magnesium 20 MG TBEC Take 40 mg by mouth daily.    [provider]  FLUoxetine (PROZAC) 20 MG capsule Take 3 capsules (60 mg total) by mouth daily. 07/11/22   Thayer Headings, PMHNP  gabapentin (NEURONTIN) 300 MG capsule Take 300 mg by mouth 3 (three) times daily. 08/01/19   [provider]  levorphanol (LEVODROMORAN) 2 MG tablet Take 2 mg by mouth 2 (two) times daily. Patient not taking: Reported on 04/07/2022    [provider]   lisinopril (PRINIVIL,ZESTRIL) 5 MG tablet Take 5 mg by mouth at bedtime.    [provider]  Multiple Vitamins-Minerals (BARIATRIC MULTIVITAMINS/IRON) CAPS Take by mouth daily.    [provider]  Oxycodone HCl 10 MG TABS Take 10 mg by mouth 5 (five) times daily.    [provider]  rosuvastatin (CRESTOR) 20 MG tablet Take 20 mg by mouth every evening.    [provider]  tamsulosin (FLOMAX) 0.4 MG CAPS capsule Take 0.4 mg by mouth at bedtime. 11/15/21   [provider]  topiramate (TOPAMAX) 50 MG tablet Take 100 mg by mouth 2 (two) times daily.    [provider]  vitamin E 180 MG (400 UNITS) capsule Take 400 Units by mouth daily.    [provider]      Allergies    Patient has no known allergies.    Review of Systems   Review of Systems  Constitutional:  Negative for chills and fever.  HENT:  Negative for congestion.   Respiratory:  Negative for cough, chest tightness, shortness of breath and wheezing.   Cardiovascular:  Negative for chest pain.  Gastrointestinal:  Negative for abdominal pain, diarrhea, nausea and vomiting.  Genitourinary:  Negative for dysuria and flank pain.  Musculoskeletal:  Negative for back pain, neck pain, neck stiffness and stiffness.  Skin:  Positive for wound (abrasion on R knee). Negative for itching and rash.  Neurological:  Negative for headaches.  Psychiatric/Behavioral:  Negative for agitation.   All other systems reviewed and are negative.   Physical Exam Updated Vital Signs BP (!) 114/49 (BP Location: Right Arm)   Pulse (!) 54   Temp 98.4 F (36.9 C) (Oral)   Resp 18   SpO2 94%  Physical Exam Constitutional:      General: He is not in acute distress.    Appearance: He is well-developed. He is not ill-appearing, toxic-appearing or diaphoretic.  HENT:     Head: Normocephalic and atraumatic.     Right Ear: External ear normal.     Left Ear: External ear normal.     Nose: Nose  normal.     Mouth/Throat:     Pharynx: No oropharyngeal exudate.  Eyes:     Conjunctiva/sclera: Conjunctivae normal.     Pupils: Pupils are equal, round, and reactive to light.  Pulmonary:     Effort: No respiratory distress.     Breath sounds: No stridor.  Abdominal:     Palpations: Abdomen is soft.     Tenderness: There is no abdominal tenderness. There is no guarding or rebound.  Musculoskeletal:        General: Swelling, tenderness and signs of injury present.     Cervical back: Normal range of motion and neck supple.     Left knee: Swelling present. No lacerations. Tenderness present.       Legs:     Comments: Tenderness to left knee with swelling.  Intact sensation, strength, pulses distally.  Has range of motion but with pain.  No hip tenderness or other injury seen.  Abrasion on right knee without significant tenderness.  Skin:    General: Skin is warm.     Coloration: Skin is not pale.     Findings: No erythema or rash.  Neurological:     Mental Status: He is alert and oriented to person, place, and time.     Sensory: No sensory deficit.     Motor: No weakness or abnormal muscle tone.     ED Results / Procedures / Treatments   Labs (all labs ordered are listed, but only abnormal results are displayed) Labs Reviewed - No data to display  EKG None  Radiology CT Knee Left Wo Contrast  Result Date: 09/04/2022 CLINICAL DATA:  Tibial plateau fracture, fall EXAM: CT OF THE LEFT KNEE WITHOUT CONTRAST TECHNIQUE: Multidetector CT imaging of the left knee was performed according to the standard protocol. Multiplanar CT image reconstructions were also generated. RADIATION DOSE REDUCTION: This exam was performed according to the departmental dose-optimization program which includes automated exposure control, adjustment of the mA and/or kV according to patient size and/or use of iterative reconstruction technique. COMPARISON:  None Available. FINDINGS: Bones/Joint/Cartilage  Mildly comminuted, impacted left lateral tibial plateau fracture is seen with the depressed fracture defect measuring roughly 2.5 x 3.0 cm and demonstrating a concave depression of of the articular surface of approximately 4 mm. The visualized femur, fibula, and patella are intact. Normal alignment. Medial femorotibial, patellofemoral, and proximal tibia fibular joint spaces appear preserved. Ligaments Suboptimally assessed by CT. Muscles and Tendons Unremarkable Soft tissues Large lipohemarthrosis present. Mild vascular calcifications noted. Venous varicosities noted within the subcutaneous medial soft tissues. IMPRESSION: 1. Mildly comminuted, impacted left lateral tibial plateau fracture with the depressed fracture defect measuring roughly 2.5 x 3.0 cm and demonstrating a concave  depression of the articular surface of approximately 4 mm. 2. Large lipohemarthrosis. Electronically Signed   By: Fidela Salisbury M.D.   On: 09/04/2022 23:16   DG Knee Complete 4 Views Left  Result Date: 09/04/2022 CLINICAL DATA:  Fall and left knee swelling. EXAM: LEFT KNEE - COMPLETE 4+ VIEW COMPARISON:  None Available. FINDINGS: There is a depressed fracture of the lateral tibial plateau. The bones are osteopenic. No dislocation. There is a small suprapatellar effusion. The soft tissues are unremarkable. IMPRESSION: Depressed fracture of the lateral tibial plateau. Electronically Signed   By: Anner Crete M.D.   On: 09/04/2022 21:10    Procedures Procedures    Medications Ordered in ED Medications - No data to display  ED Course/ Medical Decision Making/ A&P                             Medical Decision Making Amount and/or Complexity of Data Reviewed Radiology: ordered.    Theodore Miller is a 72 y.o. male with a past medical history significant for hypertension, hyperlipidemia, Parkinson's, previous gastric bypass, sleep apnea, chronic Foley catheter, and chronic pain who presents with left knee injury.   According to patient, about a month or 2 ago, he had a mechanical fall twisting his left knee.  He said that it did not bother him but he had another fall today this afternoon where he slipped on some mud and hit his left knee.  He is reporting pain and inability to bear weight.  He denies any numbness, tingling, or weakness but cannot walk on it.  Reports the pain is severe.  Denies any head injury, neck pain, or back pain.  Denies any chest or abdominal pain.   Patient complaining of pain and swelling in the left knee but otherwise denies acute injuries.  On exam, left knee is swollen and tender.  Abrasion on right knee but no laceration to left knee.  Intact pulses,.  No tenderness of the ankle or hips.  No tenderness in the back or chest.  No focal neurologic deficits.  X-ray shows evidence of tibial plateau fracture.  I spoke to Dr. Doran Durand with orthopedics who recommended CT scan, bulky Jones dressing, and calling their clinic tomorrow for outpatient follow-up.  He reports she has pain management and is on pain medicine and has crutches at home.  He will remain nonweightbearing.  A bulky dressing support was applied and patient has intact pulses strength and sensation distally.  He understands plan of care and will keep it elevated.  He will use home pain medicine.  Patient will follow-up orthopedics and return precautions.  Patient discharged in good condition.                Final Clinical Impression(s) / ED Diagnoses Final diagnoses:  Fall, initial encounter  Closed fracture of left tibial plateau, initial encounter    Rx / DC Orders ED Discharge Orders     None      Clinical Impression: 1. Fall, initial encounter   2. Closed fracture of left tibial plateau, initial encounter     Disposition: Discharge  Condition: Good  I have discussed the results, Dx and Tx plan with the pt(& family if present). He/she/they expressed understanding and agree(s) with the plan.  Discharge instructions discussed at great length. Strict return precautions discussed and pt &/or family have verbalized understanding of the instructions. No further questions at time of discharge.  New Prescriptions   No medications on file    Follow Up: Wylene Simmer, [place] [address] [phone]   with orthopedics     Danzig Macgregor, Gwenyth Allegra, MD 09/04/22 [phone]

## 2022-09-04 NOTE — ED Triage Notes (Signed)
Pt here for L knee pain after mechanical fall, pt was walking outside and  slipped on some mud. Pt denies hitting his head, no LOC, no thinners. PT was swelling and pain noted to L knee.

## 2022-09-04 NOTE — Discharge Instructions (Signed)
Your history, exam, and evaluation today revealed a depressed tibial plateau fracture from the fall today.  I spoke to orthopedics and they want you to call the office tomorrow to get scheduled for an appointment.  We did the CT scan to help with operative planning and please use your home medications help with pain.  Please use crutches and remain nonweightbearing.  We placed a bulky dressing to allow swelling and to help with stability as they recommended.  If any symptoms change or worsen acutely, please turn to the nearest emergency department.

## 2022-09-04 NOTE — ED Notes (Signed)
ED Provider at bedside. 

## 2023-01-09 ENCOUNTER — Encounter: Payer: Self-pay | Admitting: Psychiatry

## 2023-01-09 ENCOUNTER — Ambulatory Visit (INDEPENDENT_AMBULATORY_CARE_PROVIDER_SITE_OTHER): Payer: Medicare Other | Admitting: Psychiatry

## 2023-01-09 VITALS — BP 149/73 | HR 46

## 2023-01-09 DIAGNOSIS — F3341 Major depressive disorder, recurrent, in partial remission: Secondary | ICD-10-CM

## 2023-01-09 DIAGNOSIS — G4733 Obstructive sleep apnea (adult) (pediatric): Secondary | ICD-10-CM | POA: Diagnosis not present

## 2023-01-09 MED ORDER — BUPROPION HCL ER (XL) 150 MG PO TB24: ORAL_TABLET | ORAL | 1 refills | Status: DC

## 2023-01-09 MED ORDER — FLUOXETINE HCL 20 MG PO CAPS: ORAL_CAPSULE | ORAL | 1 refills | Status: DC

## 2023-01-09 MED ORDER — FLUOXETINE HCL 20 MG PO CAPS: 60.0000 mg | ORAL_CAPSULE | Freq: Every day | ORAL | 1 refills | Status: DC

## 2023-01-09 MED ORDER — ARMODAFINIL 250 MG PO TABS: 250.0000 mg | ORAL_TABLET | Freq: Every day | ORAL | 1 refills | Status: DC

## 2023-01-09 NOTE — Progress Notes (Signed)
CAFFREY HERDMAN [id number] [birth year] 72 y.o.  Subjective:   Patient ID:  Theodore Miller is a 72 y.o. (DOB 05/31/1951) male.  Chief Complaint:  Chief Complaint  Patient presents with   Follow-up    Anxiety and depression    HPI Theodore Miller presents to the office today for follow-up of depression and anxiety.   He reports that he "broke my leg... had to cancel my trip to [place]." His leg was fractured on Easter Sunday. He has had to be non-weight bearing until recently. He denies any change in mood. He reports increased appetite and weight gain. He reports that his energy and motivation- "roaring to get going." He reports rarely experiencing anxiety. Sleep has been disrupted due to physical issues. He reports concentration is "not so good." He reports that he has been having trouble thinking of names and that it is more difficult to do math calculations. Denies SI.   He is one of 13 children. He grew up in [place], [place]. He is trying to plan a trip to [place] to see his brothers.   He and his wife continue to babysit grandchildren.  Armodafinil last filled 12/12/22.   Past Psychiatric Medication Trials: Prozac- Taken since 2009. Denies any change in response. Lexapro- anxiety Cymbalta Wellbutrin Nuvigil Xanax Lamictal Depakote ER Rozerem Gabapentin- Prescribed for neuropathy Abilify- Reportedly more irritability  Flowsheet Row ED from 09/04/2022 in [facility] Department at [facility] Admission (Discharged) from 02/11/2022 in [facility] ED from 12/24/2021 in [facility] Emergency Department at [facility]  C-SSRS RISK CATEGORY No Risk No Risk No Risk        Review of Systems:  Review of Systems  Musculoskeletal:  Positive for back pain, joint swelling and neck pain. Negative for gait problem.  Neurological:        He reports nerve pain  Psychiatric/Behavioral:         Please refer to HPI    Medications: I have reviewed the patient's  current medications.  Current Outpatient Medications  Medication Sig Dispense Refill   carbidopa-levodopa (SINEMET IR) 10-100 MG tablet Take 1 tablet by mouth 3 (three) times daily.     docusate sodium (COLACE) 100 MG capsule Take 100 mg by mouth at bedtime.     Esomeprazole Magnesium 20 MG TBEC Take 40 mg by mouth daily.     gabapentin (NEURONTIN) 300 MG capsule Take 300 mg by mouth 3 (three) times daily.     lisinopril (PRINIVIL,ZESTRIL) 5 MG tablet Take 5 mg by mouth at bedtime.     Multiple Vitamins-Minerals (BARIATRIC MULTIVITAMINS/IRON) CAPS Take by mouth daily.     Oxycodone HCl 10 MG TABS Take 10 mg by mouth 5 (five) times daily.     rOPINIRole (REQUIP) 0.25 MG tablet      rosuvastatin (CRESTOR) 20 MG tablet Take 20 mg by mouth every evening.     tamsulosin (FLOMAX) 0.4 MG CAPS capsule Take 0.4 mg by mouth at bedtime.     topiramate (TOPAMAX) 50 MG tablet Take 100 mg by mouth 2 (two) times daily.     TYMLOS 3120 MCG/1.56ML SOPN Inject into the skin.     vitamin E 180 MG (400 UNITS) capsule Take 400 Units by mouth daily.     [START ON 03/06/2023] Armodafinil 250 MG tablet Take 1 tablet (250 mg total) by mouth daily. 90 tablet 1   buPROPion (WELLBUTRIN XL) 150 MG 24 hr tablet TAKE 2 TO 3 TABLETS BY  MOUTH  DAILY 270 tablet 1   FLUoxetine (PROZAC) 20 MG capsule Take 2-3 capsules daily 270 capsule 1   levorphanol (LEVODROMORAN) 2 MG tablet Take 2 mg by mouth 2 (two) times daily. (Patient not taking: Reported on 04/07/2022)     No current facility-administered medications for this visit.   Facility-Administered Medications Ordered in Other Visits  Medication Dose Route Frequency Provider Last Rate Last Admin   ondansetron (ZOFRAN) 4 mg in sodium chloride 0.9 % 50 mL IVPB  4 mg Intravenous Q6H PRN Coletta Memos, MD        Medication Side Effects: None  Allergies: No Known Allergies  Past Medical History:  Diagnosis Date   B12 deficiency    Benign essential tremor    Benign  localized prostatic hyperplasia with lower urinary tract symptoms (LUTS)    urologist--- dr gay   Chronic pain syndrome    neck and back   Foley catheter in place    GAD (generalized anxiety disorder)    GERD (gastroesophageal reflux disease)    Heart murmur    History of gastric ulcer 02/2016   History of iron deficiency anemia    History of syncope    happened 01-15-2022 but did not go to ED ,  follow up w/ pcp 01-19-2022 note in epic ,  stated happeden when he stood up got dizziness / passed out;   told by pcp orthostatic hypotension with dehydration, stated lisinopril on hold for 2 wks , stated pt was not referred to cardiology   History of thrombocytopenia    Hyperlipidemia    Hypertension    followed by pcp    (stress echo 03-02-2019 in care everywhere normal LVF and wall motion,  no sig ST-T changes   MDD (major depressive disorder)    Neuropathy    OSA (obstructive sleep apnea)    followed by neuology--- retested 06-12-2020 in epic , severe osa w/ AHI 60.5/ hr,  used bipap but stopped,  pt has been referred to ENT for possible INSPIRE;  uses several pillows when sleeping   Osteoporosis    Parkinson disease    neurologist--- hope scales NP ([facility] in [place])   Postoperative malabsorption    Prostate cancer (HCC)    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   Please see review of systems for further details on the patient's review from today.   Objective:   Physical Exam:  BP (!) 149/73   Pulse (!) 46   Physical Exam Constitutional:      General: He is not in acute distress. Musculoskeletal:        General: No deformity.  Neurological:     Mental Status: He is alert and oriented to person, place, and time.     Coordination: Coordination normal.  Psychiatric:        Attention and Perception: Attention and perception normal. He does not perceive auditory  or visual hallucinations.        Mood and Affect: Mood normal. Mood is not anxious or depressed. Affect is not labile, blunt, angry or inappropriate.        Speech: Speech normal.        Behavior: Behavior normal.        Thought Content: Thought content normal. Thought content is not paranoid or delusional. Thought content does not include homicidal or suicidal ideation. Thought content does not include  homicidal or suicidal plan.        Cognition and Memory: Cognition and memory normal.        Judgment: Judgment normal.     Comments: Insight intact     Lab Review:     Component Value Date/Time   NA 139 04/11/2022 1122   K 3.9 04/11/2022 1122   CL 111 04/11/2022 1122   CO2 24 04/11/2022 1122   GLUCOSE 96 04/11/2022 1122   BUN 23 04/11/2022 1122   CREATININE 1.38 (H) 04/11/2022 1122   CALCIUM 8.6 (L) 04/11/2022 1122   PROT 7.4 08/01/2021 0615   ALBUMIN 4.3 08/01/2021 0615   AST 29 08/01/2021 0615   ALT 38 08/01/2021 0615   ALKPHOS 68 08/01/2021 0615   BILITOT 0.4 08/01/2021 0615   GFRNONAA 55 (L) 04/11/2022 1122   GFRAA  08/15/2007 0435    >60        The eGFR has been calculated using the MDRD equation. This calculation has not been validated in all clinical       Component Value Date/Time   WBC 3.1 (L) 04/11/2022 1030   RBC 3.26 (L) 04/11/2022 1030   HGB 11.1 (L) 04/11/2022 1030   HCT 34.0 (L) 04/11/2022 1030   PLT 135 (L) 04/11/2022 1030   MCV 104.3 (H) 04/11/2022 1030   MCH 34.0 04/11/2022 1030   MCHC 32.6 04/11/2022 1030   RDW 13.6 04/11/2022 1030   LYMPHSABS 1.4 08/01/2021 0615   MONOABS 0.5 08/01/2021 0615   EOSABS 0.1 08/01/2021 0615   BASOSABS 0.0 08/01/2021 0615    No results found for: "POCLITH", "LITHIUM"   Lab Results  Component Value Date   VALPROATE 65.3 08/15/2007     .res Assessment: Plan:    25 minutes spent dedicated to the care of this patient on the date of this encounter to include pre-visit review of records, ordering of medication,  post visit documentation, and face-to-face time with the patient discussing his question about reducing medication. Discussed that he could try to reduce Prozac from 60 mg to 40 mg daily. Discussed that he could resume Prozac 60 mg daily if he experiences worsening anxiety or depression.  Will continue Wellbutrin XL for depression.  Continue Armodafinil 250 mg daily for sleep apnea and fatigue.  Pt to follow-up in 6 months or sooner if clinically indicated.  Patient advised to contact office with any questions, adverse effects, or acute worsening in signs and symptoms.   Theodore Miller was seen today for follow-up.  Diagnoses and all orders for this visit:  Obstructive sleep apnea syndrome -     Armodafinil 250 MG tablet; Take 1 tablet (250 mg total) by mouth daily.  Recurrent major depressive disorder, in partial remission (HCC) -     buPROPion (WELLBUTRIN XL) 150 MG 24 hr tablet; TAKE 2 TO 3 TABLETS BY MOUTH  DAILY -     Discontinue: FLUoxetine (PROZAC) 20 MG capsule; Take 3 capsules (60 mg total) by mouth daily. -     FLUoxetine (PROZAC) 20 MG capsule; Take 2-3 capsules daily     Please see After Visit Summary for patient specific instructions.  Future Appointments  Date Time Provider Department Center  07/12/2023 10:30 AM Corie Chiquito, PMHNP [facility] None    No orders of the defined types were placed in this encounter.   -------------------------------

## 2023-04-19 ENCOUNTER — Encounter: Payer: Self-pay | Admitting: Psychiatry

## 2023-06-08 ENCOUNTER — Other Ambulatory Visit: Payer: Self-pay | Admitting: Psychiatry

## 2023-06-08 DIAGNOSIS — F3341 Major depressive disorder, recurrent, in partial remission: Secondary | ICD-10-CM

## 2023-07-12 ENCOUNTER — Ambulatory Visit: Payer: Medicare Other | Admitting: Behavioral Health

## 2023-07-12 ENCOUNTER — Ambulatory Visit: Payer: Medicare Other | Admitting: Psychiatry

## 2023-07-12 ENCOUNTER — Encounter: Payer: Self-pay | Admitting: Behavioral Health

## 2023-07-12 DIAGNOSIS — G4733 Obstructive sleep apnea (adult) (pediatric): Secondary | ICD-10-CM | POA: Diagnosis not present

## 2023-07-12 DIAGNOSIS — F3341 Major depressive disorder, recurrent, in partial remission: Secondary | ICD-10-CM | POA: Diagnosis not present

## 2023-07-12 MED ORDER — ARMODAFINIL 250 MG PO TABS: 250.0000 mg | ORAL_TABLET | Freq: Every day | ORAL | 1 refills | Status: AC

## 2023-07-12 MED ORDER — FLUOXETINE HCL 20 MG PO CAPS: ORAL_CAPSULE | ORAL | 2 refills | Status: DC

## 2023-07-12 MED ORDER — BUPROPION HCL ER (XL) 150 MG PO TB24: ORAL_TABLET | ORAL | 2 refills | Status: DC

## 2023-07-12 NOTE — Progress Notes (Signed)
 [facility]  Patient ID: Theodore Miller,  MRN: [id number]  PCP: Millicent Sharper, MD  Date of Evaluation: 07/12/2023 Time spent:20 minutes  Chief Complaint:  Chief Complaint   Anxiety; Depression; Follow-up; Medication Refill; Patient Education     HISTORY/CURRENT STATUS: HPI Theodore Miller presents to the office today for follow-up of depression and anxiety.    Says that he is feeling ok for the most part. Has a lot of physical ailments. Has Parkinson's .  Just got over the flu a few days ag. Happy with his current medications and requesting no change. No psychosocial changes to report.  Says depression is 2/10 and anxiety is 2/10.  Is sleeping 6 hours per night on average.Denies mania, no psychosis, no auditory or visual hallucinations, or delirium. No SI or HI.     He is one of 13 children. He grew up in [place], [place] . He is trying to plan a trip to [place] to see his brothers.    He and his wife continue to babysit grandchildren.    Past Psychiatric Medication Trials: Prozac - Taken since 2009. Denies any change in response. Lexapro- anxiety Cymbalta Wellbutrin  Nuvigil  Xanax  Lamictal Depakote ER Rozerem Gabapentin - Prescribed for neuropathy Abilify - Reportedly more irritability  Individual Medical History/ Review of Systems: Changes? :No   Allergies: Patient has no known allergies.  Current Medications:  Current Outpatient Medications:    Armodafinil  250 MG tablet, Take 1 tablet (250 mg total) by mouth daily., Disp: 90 tablet, Rfl: 1   buPROPion  (WELLBUTRIN  XL) 150 MG 24 hr tablet, TAKE 2 TO 3 TABLETS BY MOUTH  DAILY, Disp: 270 tablet, Rfl: 2   carbidopa-levodopa (SINEMET IR) 10-100 MG tablet, Take 1 tablet by mouth 3 (three) times daily., Disp: , Rfl:    docusate sodium (COLACE) 100 MG capsule, Take 100 mg by mouth at bedtime., Disp: , Rfl:    Esomeprazole Magnesium 20 MG TBEC, Take 40 mg by mouth daily., Disp: , Rfl:    FLUoxetine  (PROZAC ) 20  MG capsule, Take 2-3 capsules daily, Disp: 270 capsule, Rfl: 2   gabapentin  (NEURONTIN ) 300 MG capsule, Take 300 mg by mouth 3 (three) times daily., Disp: , Rfl:    levorphanol (LEVODROMORAN) 2 MG tablet, Take 2 mg by mouth 2 (two) times daily. (Patient not taking: Reported on 04/07/2022), Disp: , Rfl:    lisinopril  (PRINIVIL ,ZESTRIL ) 5 MG tablet, Take 5 mg by mouth at bedtime., Disp: , Rfl:    Multiple Vitamins-Minerals (BARIATRIC MULTIVITAMINS/IRON ) CAPS, Take by mouth daily., Disp: , Rfl:    Oxycodone  HCl 10 MG TABS, Take 10 mg by mouth 5 (five) times daily., Disp: , Rfl:    rOPINIRole (REQUIP) 0.25 MG tablet, , Disp: , Rfl:    rosuvastatin  (CRESTOR ) 20 MG tablet, Take 20 mg by mouth every evening., Disp: , Rfl:    tamsulosin  (FLOMAX ) 0.4 MG CAPS capsule, Take 0.4 mg by mouth at bedtime., Disp: , Rfl:    topiramate  (TOPAMAX ) 50 MG tablet, Take 100 mg by mouth 2 (two) times daily., Disp: , Rfl:    TYMLOS 3120 MCG/1.56ML SOPN, Inject into the skin., Disp: , Rfl:    vitamin E 180 MG (400 UNITS) capsule, Take 400 Units by mouth daily., Disp: , Rfl:  No current facility-administered medications for this visit.  Facility-Administered Medications Ordered in Other Visits:    ondansetron  (ZOFRAN ) 4 mg in sodium chloride  0.9 % 50 mL IVPB, 4 mg, Intravenous, Q6H PRN, Cabbell, Kyle, MD Medication Side Effects: none  Family Medical/ Social History: Changes? No  MENTAL HEALTH EXAM:  There were no vitals taken for this visit.There is no height or weight on file to calculate BMI.  General Appearance: Casual, Neat, and Well Groomed  Eye Contact:  Good  Speech:  Clear and Coherent  Volume:  Normal  Mood:  NA  Affect:  Appropriate  Thought Process:  Coherent  Orientation:  Full (Time, Place, and Person)  Thought Content: Logical   Suicidal Thoughts:  No  Homicidal Thoughts:  No  Memory:  WNL  Judgement:  Good  Insight:  Good  Psychomotor Activity:  Normal  Concentration:  Concentration: Good   Recall:  Good  Fund of Knowledge: Good  Language: Good  Assets:  Desire for Improvement  ADL's:  Intact  Cognition: WNL  Prognosis:  Good    DIAGNOSES:    ICD-10-CM   1. Recurrent major depressive disorder, in partial remission (HCC)  F33.41 buPROPion  (WELLBUTRIN  XL) 150 MG 24 hr tablet    FLUoxetine  (PROZAC ) 20 MG capsule    2. Obstructive sleep apnea syndrome  G47.33 Armodafinil  250 MG tablet      Receiving Psychotherapy: No    RECOMMENDATIONS:   20  minutes spent dedicated to the care of this patient on the date of this encounter to include pre-visit review of records, ordering of medication, post visit documentation, and face-to-face time with the patient discussing his question about reducing medication.   Agreed to: To continue Prozac  2-3 capsules daily Will continue Wellbutrin   150 mgXL for depression.  Continue Armodafinil  250 mg daily for sleep apnea and fatigue.  Pt to follow-up in 6 months or sooner if clinically indicated.  Patient advised to contact office with any questions, adverse effects, or acute worsening in signs and symptoms.  Discussed potential benefits, risks, and side effects of stimulants with patient to include increased heart rate, palpitations, insomnia, increased anxiety, increased irritability, or decreased appetite.  Instructed patient to contact office if experiencing any significant tolerability issues.   Reviewed PDMP   Theodore DELENA Pizza, NP

## 2023-07-26 ENCOUNTER — Telehealth: Payer: Self-pay

## 2023-07-26 NOTE — Telephone Encounter (Signed)
 Prior authorization request form for Armodafinil 250 mg completed and submitted to [facility] at [phone]

## 2024-01-09 ENCOUNTER — Ambulatory Visit: Admitting: Behavioral Health

## 2024-01-10 ENCOUNTER — Ambulatory Visit: Payer: Medicare Other | Admitting: Behavioral Health

## 2024-01-22 ENCOUNTER — Ambulatory Visit: Admitting: Behavioral Health

## 2024-02-09 ENCOUNTER — Encounter: Payer: Self-pay | Admitting: Behavioral Health

## 2024-02-09 ENCOUNTER — Ambulatory Visit: Admitting: Behavioral Health

## 2024-02-09 DIAGNOSIS — F3341 Major depressive disorder, recurrent, in partial remission: Secondary | ICD-10-CM

## 2024-02-09 DIAGNOSIS — G4733 Obstructive sleep apnea (adult) (pediatric): Secondary | ICD-10-CM

## 2024-02-09 MED ORDER — FLUOXETINE HCL 20 MG PO CAPS: ORAL_CAPSULE | ORAL | 2 refills | Status: AC

## 2024-02-09 MED ORDER — BUPROPION HCL ER (XL) 150 MG PO TB24: ORAL_TABLET | ORAL | 2 refills | Status: AC

## 2024-02-09 MED ORDER — ARMODAFINIL 150 MG PO TABS: 150.0000 mg | ORAL_TABLET | Freq: Every day | ORAL | 1 refills | Status: AC

## 2024-02-09 NOTE — Progress Notes (Signed)
 [facility]  Patient ID: Theodore Miller,  MRN: [id number]  PCP: Millicent Sharper, MD  Date of Evaluation: 02/09/2024 Time spent:20 minutes  Chief Complaint:  Chief Complaint   Depression; Follow-up; Patient Education; Medication Refill     HISTORY/CURRENT STATUS: HPI Theodore Miller presents to the office today for follow-up of depression and anxiety.    Happy with his current medications and requesting no change. He says that he is at the point where he would like to consider weaning off some of his medication and does not feel he needs as much. Would like to discuss today. No psychosocial changes to report.  Says depression is 2/10 and anxiety is 2/10.  Is sleeping 6 hours per night on average.Denies mania, no psychosis, no auditory or visual hallucinations, or delirium. No SI or HI.      Past Psychiatric Medication Trials: Prozac - Taken since 2009. Denies any change in response. Lexapro- anxiety Cymbalta Wellbutrin  Nuvigil  Xanax  Lamictal Depakote ER Rozerem Gabapentin - Prescribed for neuropathy Abilify - Reportedly more irritability   Individual Medical History/ Review of Systems: Changes? :No   Allergies: Patient has no known allergies.  Current Medications:  Current Outpatient Medications:    Armodafinil  150 MG tablet, Take 1 tablet (150 mg total) by mouth daily., Disp: 90 tablet, Rfl: 1   Armodafinil  250 MG tablet, Take 1 tablet (250 mg total) by mouth daily., Disp: 90 tablet, Rfl: 1   buPROPion  (WELLBUTRIN  XL) 150 MG 24 hr tablet, TAKE 2 TO 3 TABLETS BY MOUTH  DAILY, Disp: 270 tablet, Rfl: 2   carbidopa-levodopa (SINEMET IR) 10-100 MG tablet, Take 1 tablet by mouth 3 (three) times daily., Disp: , Rfl:    docusate sodium (COLACE) 100 MG capsule, Take 100 mg by mouth at bedtime., Disp: , Rfl:    Esomeprazole Magnesium 20 MG TBEC, Take 40 mg by mouth daily., Disp: , Rfl:    FLUoxetine  (PROZAC ) 20 MG capsule, Take one capsule by mouth daily, Disp: 90  capsule, Rfl: 2   gabapentin  (NEURONTIN ) 300 MG capsule, Take 300 mg by mouth 3 (three) times daily., Disp: , Rfl:    levorphanol (LEVODROMORAN) 2 MG tablet, Take 2 mg by mouth 2 (two) times daily. (Patient not taking: Reported on 04/07/2022), Disp: , Rfl:    lisinopril  (PRINIVIL ,ZESTRIL ) 5 MG tablet, Take 5 mg by mouth at bedtime., Disp: , Rfl:    Multiple Vitamins-Minerals (BARIATRIC MULTIVITAMINS/IRON ) CAPS, Take by mouth daily., Disp: , Rfl:    Oxycodone  HCl 10 MG TABS, Take 10 mg by mouth 5 (five) times daily., Disp: , Rfl:    rOPINIRole (REQUIP) 0.25 MG tablet, , Disp: , Rfl:    rosuvastatin  (CRESTOR ) 20 MG tablet, Take 20 mg by mouth every evening., Disp: , Rfl:    tamsulosin  (FLOMAX ) 0.4 MG CAPS capsule, Take 0.4 mg by mouth at bedtime., Disp: , Rfl:    topiramate  (TOPAMAX ) 50 MG tablet, Take 100 mg by mouth 2 (two) times daily., Disp: , Rfl:    TYMLOS 3120 MCG/1.56ML SOPN, Inject into the skin., Disp: , Rfl:    vitamin E 180 MG (400 UNITS) capsule, Take 400 Units by mouth daily., Disp: , Rfl:  No current facility-administered medications for this visit.  Facility-Administered Medications Ordered in Other Visits:    ondansetron  (ZOFRAN ) 4 mg in sodium chloride  0.9 % 50 mL IVPB, 4 mg, Intravenous, Q6H PRN, Cabbell, Kyle, MD Medication Side Effects: none  Family Medical/ Social History: Changes? No  MENTAL HEALTH EXAM:  There were  no vitals taken for this visit.There is no height or weight on file to calculate BMI.  General Appearance: Casual, Neat, and Well Groomed  Eye Contact:  Good  Speech:  Clear and Coherent  Volume:  Normal  Mood:  NA  Affect:  Appropriate  Thought Process:  Coherent  Orientation:  Full (Time, Place, and Person)  Thought Content: Logical   Suicidal Thoughts:  No  Homicidal Thoughts:  No  Memory:  WNL  Judgement:  Good  Insight:  Good  Psychomotor Activity:  Normal  Concentration:  Concentration: Good  Recall:  Good  Fund of Knowledge: Good   Language: Good  Assets:  Desire for Improvement  ADL's:  Intact  Cognition: WNL  Prognosis:  Good    DIAGNOSES:    ICD-10-CM   1. Recurrent major depressive disorder, in partial remission (HCC)  F33.41 buPROPion  (WELLBUTRIN  XL) 150 MG 24 hr tablet    FLUoxetine  (PROZAC ) 20 MG capsule    Armodafinil  150 MG tablet    2. Obstructive sleep apnea syndrome  G47.33 Armodafinil  150 MG tablet      Receiving Psychotherapy: No    RECOMMENDATIONS:  20  minutes spent dedicated to the care of this patient on the date of this encounter to include pre-visit review of records, ordering of medication, post visit documentation, and face-to-face time with the patient discussing his question about reducing medication. Discussed his concerns about medication and agreed to reduce his medications over time.    Agreed to: Reduce  Prozac  to one capsules daily Will continue Wellbutrin   150 mgXL for depression.  Reduce Armodafinil  150  mg daily for sleep apnea and fatigue.  Pt to follow-up in 6 months or sooner if clinically indicated.  Patient advised to contact office with any questions, adverse effects, or acute worsening in signs and symptoms.  Discussed potential benefits, risks, and side effects of stimulants with patient to include increased heart rate, palpitations, insomnia, increased anxiety, increased irritability, or decreased appetite.  Instructed patient to contact office if experiencing any significant tolerability issues.   Reviewed PDMP     [name], NP

## 2024-08-08 ENCOUNTER — Ambulatory Visit: Admitting: Behavioral Health
